# Patient Record
Sex: Male | Born: 1990 | Race: White | Hispanic: No | Marital: Single | Smoking: Former smoker
Health system: Southern US, Community
[De-identification: ages and names within clinical notes are randomized; demographics above are authoritative.]

## PROBLEM LIST (undated history)

## (undated) DIAGNOSIS — R0602 Shortness of breath: Secondary | ICD-10-CM

## (undated) DIAGNOSIS — G709 Myoneural disorder, unspecified: Secondary | ICD-10-CM

## (undated) HISTORY — PX: OTHER SURGICAL HISTORY: SHX169

---

## 2011-07-30 ENCOUNTER — Other Ambulatory Visit: Payer: Self-pay

## 2011-07-30 DIAGNOSIS — J9859 Other diseases of mediastinum, not elsewhere classified: Secondary | ICD-10-CM | POA: Insufficient documentation

## 2011-07-31 ENCOUNTER — Encounter: Payer: BC Managed Care – PPO | Admitting: Thoracic Surgery (Cardiothoracic Vascular Surgery)

## 2011-07-31 ENCOUNTER — Other Ambulatory Visit: Payer: Self-pay

## 2011-08-05 ENCOUNTER — Institutional Professional Consult (permissible substitution) (INDEPENDENT_AMBULATORY_CARE_PROVIDER_SITE_OTHER): Payer: BC Managed Care – PPO | Admitting: Thoracic Surgery (Cardiothoracic Vascular Surgery)

## 2011-08-05 ENCOUNTER — Encounter: Payer: Self-pay | Admitting: Thoracic Surgery (Cardiothoracic Vascular Surgery)

## 2011-08-05 VITALS — BP 116/76 | HR 72 | Resp 18 | Ht 70.5 in | Wt 215.0 lb

## 2011-08-05 DIAGNOSIS — D382 Neoplasm of uncertain behavior of pleura: Secondary | ICD-10-CM

## 2011-08-05 DIAGNOSIS — D384 Neoplasm of uncertain behavior of thymus: Secondary | ICD-10-CM

## 2011-08-05 NOTE — Progress Notes (Signed)
PCP is [name],[name], MD, MD Referring Provider is Philemon Kingdom, MD  Chief Complaint  Patient presents with  . Mediastinal Mass    Referral from Dr Sudie Bailey for eval on mediastinal mass seen on Chest CT 07/22/11    HPI: Christian Ho is a 21 year old young man who presents with the chief complaint left shoulder pain. He says this been having pain in his left shoulder on and off for about the last 8 or 9 months. He attributed this to working out. This is mainly hurts in the pectoral area on the left side. He says sometimes simple is region of his left arm to wash the right side of his body in the shower can cause severe pain. He's had to curtail his workout regimen. He feels like the pain is deeper than the muscle. More recently he had an episode where he had some difficulty swallowing, that lasted a couple of days. He is also been noticing some shortness of breath sometimes when he lies flat and sometimes went active. He went to an urgent care one time when he was having some shortness of breath and the pain, chest x-ray at that time did not show any abnormality. Shortly thereafter he had a more severe episode and he went to the emergency room. A CT of the chest was done which showed a 2.9 x 2.4 cm mass in the anterior mediastinum. He saw Dr. Sudie Bailey. Thyroid panel was done as were alpha-fetoprotein and beta hCG. Both AFP and hCG were within normal limits.   No past medical history on file.  Past Surgical History  Procedure Date  . Right wrist orif   . Left club foot surgery in 1991     Family History  Problem Relation Age of Onset  . Breast cancer Maternal Grandmother   . Lung cancer Maternal Grandmother     Social History History  Substance Use Topics  . Smoking status: Former Smoker    Types: Cigarettes    Quit date: 05/12/2010  . Smokeless tobacco: Not on file  . Alcohol Use: No    Current Outpatient Prescriptions  Medication Sig Dispense Refill  . traMADol (ULTRAM)  50 MG tablet         No Known Allergies  Review of Systems  Constitutional: Negative.   HENT: Negative.   Respiratory: Positive for shortness of breath.   Cardiovascular: Positive for chest pain.  Gastrointestinal:       Difficulty swallowing  Neurological: Positive for numbness (left arm numbness on one occasion).  Hematological: Negative.   All other systems reviewed and are negative.    BP 116/76  Pulse 72  Resp 18  Ht 5' 10.5" (1.791 m)  Wt 215 lb (97.523 kg)  BMI 30.41 kg/m2  SpO2 98% Physical Exam  Constitutional: He is oriented to person, place, and time. He appears well-developed and well-nourished. No distress.  HENT:  Head: Normocephalic and atraumatic.  Eyes: EOM are normal. Pupils are equal, round, and reactive to light.  Neck: Neck supple. No JVD present. No tracheal deviation present. No thyromegaly present.  Cardiovascular: Normal rate, regular rhythm and intact distal pulses.   Pulmonary/Chest: Effort normal and breath sounds normal. He has no wheezes. He has no rales.  Abdominal: Soft.  Musculoskeletal: He exhibits no edema.  Lymphadenopathy:    He has no cervical adenopathy.  Neurological: He is alert and oriented to person, place, and time. No cranial nerve deficit.  Skin: Skin is warm and dry.  Psychiatric: He has  a normal mood and affect.     Diagnostic Tests: CT chest at [facility] 07/23/2011- 2.9 x 2.7 cm anterior mediastinal mass. No other masses or adenopathy.  Impression: 21 year old male with a 2.5 to 3 cm mass in the anterior mediastinum. The differential diagnosis includes thymoma, thyroid, lymphoma, returned cell tumors. 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 It's very isolated to be a lymphoma. Terms of tumors not ruled out but is less likely in the setting of negative tumor markers. The lesion appears characteristic for a thymoma. The lesion itself is fairly well circumscribed and lobulated in its midportion,  however it does become indistinct in the superior and inferior margins. It is not certain that this lesion is responsible for any of his symptoms although it is concerning that he possibly could have some local invasion causing symptoms. Given his young age and otherwise good health as well as symptomatology I think it would be best to go ahead and resect this mass  I discussed with the patient and his mother who was present the indications, risks, benefits, and alternatives. They understand this her radiographic followup would be an alternative, but are not interested in that approach. We discussed different surgical approaches to resection including a left VATS versus a partial upper sternotomy. I think in his case with partial upper sternotomy would be the better option. I discussed with them the general nature of the procedure, need for general anesthesia, expected hospital stay, and overall recovery. I did discuss with them the risks, which are very low given his young age and general good health, of the procedure. They understand the risks include but are not limited to death(remote possibility), DVT, PE, bleeding, possible need for transfusion, infection, other organ system dysfunction. They do understand there is no guarantee that resecting the mass will relieve his symptoms as, once again, it is not entirely clear this source of the symptomatology.  He wishes to proceed with surgical resection.  Plan: And thymectomy via partial upper sternotomy on Wednesday, April 3. He will be admitted on the day of surgery.

## 2011-08-06 ENCOUNTER — Other Ambulatory Visit: Payer: Self-pay

## 2011-08-06 ENCOUNTER — Encounter (HOSPITAL_COMMUNITY): Payer: Self-pay | Admitting: Pharmacy Technician

## 2011-08-06 DIAGNOSIS — R222 Localized swelling, mass and lump, trunk: Secondary | ICD-10-CM

## 2011-08-11 ENCOUNTER — Ambulatory Visit (HOSPITAL_COMMUNITY)
Admission: RE | Admit: 2011-08-11 | Discharge: 2011-08-11 | Disposition: A | Discharge status: Home / Self Care | Payer: BC Managed Care – PPO | Source: Ambulatory Visit | Attending: Thoracic Surgery (Cardiothoracic Vascular Surgery) | Admitting: Thoracic Surgery (Cardiothoracic Vascular Surgery)

## 2011-08-11 ENCOUNTER — Encounter (HOSPITAL_COMMUNITY): Payer: Self-pay

## 2011-08-11 ENCOUNTER — Encounter (HOSPITAL_COMMUNITY)
Admission: RE | Admit: 2011-08-11 | Discharge: 2011-08-11 | Disposition: A | Discharge status: Home / Self Care | Payer: BC Managed Care – PPO | Source: Ambulatory Visit | Attending: Thoracic Surgery (Cardiothoracic Vascular Surgery) | Admitting: Thoracic Surgery (Cardiothoracic Vascular Surgery)

## 2011-08-11 VITALS — BP 118/78 | HR 67 | Temp 97.0°F | Resp 20 | Ht 70.0 in | Wt 213.4 lb

## 2011-08-11 DIAGNOSIS — Z01812 Encounter for preprocedural laboratory examination: Secondary | ICD-10-CM | POA: Insufficient documentation

## 2011-08-11 DIAGNOSIS — R222 Localized swelling, mass and lump, trunk: Secondary | ICD-10-CM

## 2011-08-11 DIAGNOSIS — Z01818 Encounter for other preprocedural examination: Secondary | ICD-10-CM | POA: Insufficient documentation

## 2011-08-11 HISTORY — DX: Myoneural disorder, unspecified: G70.9

## 2011-08-11 HISTORY — DX: Shortness of breath: R06.02

## 2011-08-11 LAB — BLOOD GAS, ARTERIAL
Bicarbonate: 21.1 mEq/L (ref 20.0–24.0)
O2 Saturation: 96.2 %
Patient temperature: 98.6
TCO2: 22.2 mmol/L (ref 0–100)

## 2011-08-11 LAB — URINALYSIS, ROUTINE W REFLEX MICROSCOPIC
Bilirubin Urine: NEGATIVE
Hgb urine dipstick: NEGATIVE
Specific Gravity, Urine: 1.021 (ref 1.005–1.030)
Urobilinogen, UA: 0.2 mg/dL (ref 0.0–1.0)

## 2011-08-11 LAB — COMPREHENSIVE METABOLIC PANEL
Alkaline Phosphatase: 86 U/L (ref 39–117)
BUN: 18 mg/dL (ref 6–23)
Calcium: 10 mg/dL (ref 8.4–10.5)
Creatinine, Ser: 1.06 mg/dL (ref 0.50–1.35)
GFR calc Af Amer: >90 mL/min (ref >90)
Glucose, Bld: 71 mg/dL (ref 70–99)
Total Protein: 7.7 g/dL (ref 6.0–8.3)

## 2011-08-11 LAB — PROTIME-INR
INR: 1.04 (ref 0.00–1.49)
Prothrombin Time: 13.8 seconds (ref 11.6–15.2)

## 2011-08-11 LAB — TYPE AND SCREEN
ABO/RH(D): O NEG
Antibody Screen: NEGATIVE

## 2011-08-11 LAB — CBC
HCT: 44.4 % (ref 39.0–52.0)
Hemoglobin: 14.5 g/dL (ref 13.0–17.0)
MCH: 27.1 pg (ref 26.0–34.0)
MCHC: 32.7 g/dL (ref 30.0–36.0)
MCV: 82.8 fL (ref 78.0–100.0)

## 2011-08-11 LAB — ABO/RH: ABO/RH(D): O NEG

## 2011-08-11 LAB — SURGICAL PCR SCREEN: Staphylococcus aureus: NEGATIVE

## 2011-08-11 NOTE — Pre-Procedure Instructions (Signed)
20 MAKENZIE VITTORIO  08/11/2011   Your procedure is scheduled on:  08/13/2011  Report to [facility] at 6:30 AM.  Call this number if you have problems the morning of surgery: [phone]   Remember:   Do not eat food:After Midnight.  May have clear liquids: up to 4 Hours before arrival.  Nothing after 2:30a.m.  Clear liquids include soda, tea, black coffee, apple or grape juice, broth.  Take these medicines the morning of surgery with A SIP OF WATER: NOTHING   Do not wear jewelry, make-up or nail polish.  Do not wear lotions, powders, or perfumes. You may wear deodorant.  Do not shave 48 hours prior to surgery.  Do not bring valuables to the hospital.  Contacts, dentures or bridgework may not be worn into surgery.  Leave suitcase in the car. After surgery it may be brought to your room.  For patients admitted to the hospital, checkout time is 11:00 AM the day of discharge.   Patients discharged the day of surgery will not be allowed to drive home.  Name and phone number of your driver: with Mother   Special Instructions: CHG Shower Use Special Wash: 1/2 bottle night before surgery and 1/2 bottle morning of surgery.   Please read over the following fact sheets that you were given: Pain Booklet, Coughing and Deep Breathing, Blood Transfusion Information, MRSA Information and Surgical Site Infection Prevention

## 2011-08-11 NOTE — Progress Notes (Signed)
Call to [facility] to clarify the prep order. Determined that the prep should be axilla (bilateral) & chest.

## 2011-08-11 NOTE — Progress Notes (Signed)
Call to [facility] requested EKG- done in ER on 07/22/2011

## 2011-08-12 MED ORDER — CEFUROXIME SODIUM 1.5 G IJ SOLR
1.5000 g | INTRAMUSCULAR | Status: AC
  Administered 2011-08-13: 1.5 g via INTRAVENOUS
  Filled 2011-08-12: qty 1.5

## 2011-08-13 ENCOUNTER — Encounter (HOSPITAL_COMMUNITY)
Admission: RE | Disposition: A | Discharge status: Home / Self Care | Payer: Self-pay | Source: Ambulatory Visit | Attending: Thoracic Surgery (Cardiothoracic Vascular Surgery)

## 2011-08-13 ENCOUNTER — Encounter (HOSPITAL_COMMUNITY): Payer: Self-pay | Admitting: Anesthesiology

## 2011-08-13 ENCOUNTER — Ambulatory Visit (HOSPITAL_COMMUNITY): Payer: BC Managed Care – PPO | Admitting: Anesthesiology

## 2011-08-13 ENCOUNTER — Ambulatory Visit (HOSPITAL_COMMUNITY): Payer: BC Managed Care – PPO

## 2011-08-13 ENCOUNTER — Encounter (HOSPITAL_COMMUNITY): Payer: Self-pay | Admitting: *Deleted

## 2011-08-13 ENCOUNTER — Inpatient Hospital Stay (HOSPITAL_COMMUNITY)
Admission: RE | Admit: 2011-08-13 | Discharge: 2011-08-16 | DRG: 394 | Disposition: A | Discharge status: Home / Self Care | Payer: BC Managed Care – PPO | Source: Ambulatory Visit | Attending: Thoracic Surgery (Cardiothoracic Vascular Surgery) | Admitting: Thoracic Surgery (Cardiothoracic Vascular Surgery)

## 2011-08-13 DIAGNOSIS — R222 Localized swelling, mass and lump, trunk: Secondary | ICD-10-CM

## 2011-08-13 DIAGNOSIS — E328 Other diseases of thymus: Principal | ICD-10-CM | POA: Diagnosis present

## 2011-08-13 DIAGNOSIS — R110 Nausea: Secondary | ICD-10-CM | POA: Diagnosis not present

## 2011-08-13 HISTORY — PX: MEDIASTERNOTOMY: SHX5084

## 2011-08-13 SURGERY — MEDIAN STERNOTOMY
Anesthesia: General | Site: Chest | Wound class: Clean

## 2011-08-13 MED ORDER — KETOROLAC TROMETHAMINE 30 MG/ML IJ SOLN
30.0000 mg | Freq: Four times a day (QID) | INTRAMUSCULAR | Status: AC
  Administered 2011-08-13 – 2011-08-15 (×8): 30 mg via INTRAVENOUS
  Filled 2011-08-13 (×10): qty 1

## 2011-08-13 MED ORDER — SODIUM CHLORIDE 0.9 % IJ SOLN: 9.0000 mL | INTRAMUSCULAR | Status: DC | PRN

## 2011-08-13 MED ORDER — DIPHENHYDRAMINE HCL 12.5 MG/5ML PO ELIX
12.5000 mg | ORAL_SOLUTION | Freq: Four times a day (QID) | ORAL | Status: DC | PRN
  Filled 2011-08-13: qty 5

## 2011-08-13 MED ORDER — HYDROMORPHONE HCL PF 1 MG/ML IJ SOLN
INTRAMUSCULAR | Status: AC
  Administered 2011-08-13: 0.5 mg via INTRAVENOUS
  Filled 2011-08-13: qty 1

## 2011-08-13 MED ORDER — FENTANYL 10 MCG/ML IV SOLN
INTRAVENOUS | Status: DC
  Administered 2011-08-13: 120 ug via INTRAVENOUS
  Administered 2011-08-13: 225 ug via INTRAVENOUS
  Administered 2011-08-13: 13:00:00 via INTRAVENOUS
  Administered 2011-08-13: 45 ug via INTRAVENOUS
  Administered 2011-08-13: 90 ug via INTRAVENOUS
  Administered 2011-08-14: 156 ug via INTRAVENOUS
  Administered 2011-08-14: 120 ug via INTRAVENOUS
  Administered 2011-08-14: 162.9 ug via INTRAVENOUS
  Filled 2011-08-13 (×3): qty 50

## 2011-08-13 MED ORDER — ONDANSETRON HCL 4 MG/2ML IJ SOLN
4.0000 mg | Freq: Four times a day (QID) | INTRAMUSCULAR | Status: DC | PRN
  Filled 2011-08-13: qty 2

## 2011-08-13 MED ORDER — 0.9 % SODIUM CHLORIDE (POUR BTL) OPTIME
TOPICAL | Status: DC | PRN
  Administered 2011-08-13: 1000 mL

## 2011-08-13 MED ORDER — DIPHENHYDRAMINE HCL 50 MG/ML IJ SOLN
12.5000 mg | Freq: Four times a day (QID) | INTRAMUSCULAR | Status: DC | PRN
  Filled 2011-08-13: qty 0.25

## 2011-08-13 MED ORDER — LACTATED RINGERS IV SOLN
INTRAVENOUS | Status: DC | PRN
  Administered 2011-08-13: 08:00:00 via INTRAVENOUS

## 2011-08-13 MED ORDER — POTASSIUM CHLORIDE 10 MEQ/50ML IV SOLN: 10.0000 meq | Freq: Every day | INTRAVENOUS | Status: DC | PRN

## 2011-08-13 MED ORDER — VECURONIUM BROMIDE 10 MG IV SOLR
INTRAVENOUS | Status: DC | PRN
  Administered 2011-08-13 (×2): 1 mg via INTRAVENOUS
  Administered 2011-08-13: 2 mg via INTRAVENOUS
  Administered 2011-08-13: 1 mg via INTRAVENOUS

## 2011-08-13 MED ORDER — KETOROLAC TROMETHAMINE 30 MG/ML IJ SOLN
INTRAMUSCULAR | Status: AC
  Administered 2011-08-13: 30 mg via INTRAVENOUS
  Filled 2011-08-13: qty 1

## 2011-08-13 MED ORDER — OXYCODONE-ACETAMINOPHEN 5-325 MG PO TABS
1.0000 | ORAL_TABLET | ORAL | Status: DC | PRN
  Administered 2011-08-15: 1 via ORAL
  Administered 2011-08-15 – 2011-08-16 (×4): 2 via ORAL
  Filled 2011-08-13 (×4): qty 2
  Filled 2011-08-13: qty 1

## 2011-08-13 MED ORDER — PROPOFOL 10 MG/ML IV EMUL
INTRAVENOUS | Status: DC | PRN
  Administered 2011-08-13: 200 mg via INTRAVENOUS

## 2011-08-13 MED ORDER — ACETAMINOPHEN 10 MG/ML IV SOLN
INTRAVENOUS | Status: AC
  Administered 2011-08-13: 1000 mg via INTRAVENOUS
  Filled 2011-08-13: qty 100

## 2011-08-13 MED ORDER — BISACODYL 5 MG PO TBEC
10.0000 mg | DELAYED_RELEASE_TABLET | Freq: Every day | ORAL | Status: DC
  Administered 2011-08-14 – 2011-08-16 (×3): 10 mg via ORAL
  Filled 2011-08-13 (×3): qty 2

## 2011-08-13 MED ORDER — HYDROMORPHONE HCL PF 1 MG/ML IJ SOLN
0.2500 mg | INTRAMUSCULAR | Status: DC | PRN
  Administered 2011-08-13 (×5): 0.5 mg via INTRAVENOUS

## 2011-08-13 MED ORDER — HYDROMORPHONE HCL PF 1 MG/ML IJ SOLN
0.5000 mg | INTRAMUSCULAR | Status: DC | PRN
  Administered 2011-08-13: 0.5 mg via INTRAVENOUS

## 2011-08-13 MED ORDER — ONDANSETRON HCL 4 MG/2ML IJ SOLN: 4.0000 mg | Freq: Once | INTRAMUSCULAR | Status: DC | PRN

## 2011-08-13 MED ORDER — OXYCODONE HCL 5 MG PO TABS: 5.0000 mg | ORAL_TABLET | ORAL | Status: AC | PRN

## 2011-08-13 MED ORDER — LACTATED RINGERS IV SOLN
INTRAVENOUS | Status: DC | PRN
  Administered 2011-08-13 (×2): via INTRAVENOUS

## 2011-08-13 MED ORDER — SODIUM CHLORIDE 0.9 % IJ SOLN
OROMUCOSAL | Status: DC | PRN
  Administered 2011-08-13: 10:00:00 via TOPICAL

## 2011-08-13 MED ORDER — NALOXONE HCL 0.4 MG/ML IJ SOLN
0.4000 mg | INTRAMUSCULAR | Status: DC | PRN
  Filled 2011-08-13: qty 1

## 2011-08-13 MED ORDER — LIDOCAINE HCL (CARDIAC) 20 MG/ML IV SOLN
INTRAVENOUS | Status: DC | PRN
  Administered 2011-08-13: 60 mg via INTRAVENOUS

## 2011-08-13 MED ORDER — FENTANYL CITRATE 0.05 MG/ML IJ SOLN
INTRAMUSCULAR | Status: DC | PRN
  Administered 2011-08-13: 50 ug via INTRAVENOUS
  Administered 2011-08-13: 350 ug via INTRAVENOUS
  Administered 2011-08-13 (×2): 100 ug via INTRAVENOUS
  Administered 2011-08-13: 25 ug via INTRAVENOUS
  Administered 2011-08-13: 50 ug via INTRAVENOUS
  Administered 2011-08-13: 25 ug via INTRAVENOUS
  Administered 2011-08-13: 50 ug via INTRAVENOUS

## 2011-08-13 MED ORDER — DEXTROSE-NACL 5-0.45 % IV SOLN
INTRAVENOUS | Status: DC
  Administered 2011-08-13 – 2011-08-14 (×3): via INTRAVENOUS
  Administered 2011-08-15: 20 mL/h via INTRAVENOUS

## 2011-08-13 MED ORDER — MIDAZOLAM HCL 5 MG/5ML IJ SOLN
INTRAMUSCULAR | Status: DC | PRN
  Administered 2011-08-13 (×2): 2 mg via INTRAVENOUS

## 2011-08-13 MED ORDER — ROCURONIUM BROMIDE 100 MG/10ML IV SOLN
INTRAVENOUS | Status: DC | PRN
  Administered 2011-08-13: 50 mg via INTRAVENOUS

## 2011-08-13 MED ORDER — NEOSTIGMINE METHYLSULFATE 1 MG/ML IJ SOLN
INTRAMUSCULAR | Status: DC | PRN
  Administered 2011-08-13: 5 mg via INTRAVENOUS

## 2011-08-13 MED ORDER — ONDANSETRON HCL 4 MG/2ML IJ SOLN
4.0000 mg | Freq: Four times a day (QID) | INTRAMUSCULAR | Status: DC | PRN
  Administered 2011-08-13: 4 mg via INTRAVENOUS
  Filled 2011-08-13: qty 2

## 2011-08-13 MED ORDER — ONDANSETRON HCL 4 MG/2ML IJ SOLN
INTRAMUSCULAR | Status: DC | PRN
  Administered 2011-08-13: 4 mg via INTRAVENOUS

## 2011-08-13 MED ORDER — SENNOSIDES-DOCUSATE SODIUM 8.6-50 MG PO TABS
1.0000 | ORAL_TABLET | Freq: Every evening | ORAL | Status: DC | PRN
  Filled 2011-08-13: qty 1

## 2011-08-13 MED ORDER — MIDAZOLAM HCL 2 MG/2ML IJ SOLN: 2.0000 mg | Freq: Once | INTRAMUSCULAR | Status: DC

## 2011-08-13 MED ORDER — ACETAMINOPHEN 10 MG/ML IV SOLN
1000.0000 mg | Freq: Four times a day (QID) | INTRAVENOUS | Status: AC
  Administered 2011-08-13 – 2011-08-14 (×3): 1000 mg via INTRAVENOUS
  Filled 2011-08-13 (×6): qty 100

## 2011-08-13 MED ORDER — GLYCOPYRROLATE 0.2 MG/ML IJ SOLN
INTRAMUSCULAR | Status: DC | PRN
  Administered 2011-08-13: 1 mg via INTRAVENOUS

## 2011-08-13 SURGICAL SUPPLY — 53 items
APPLIER CLIP 9.375 SM OPEN (CLIP) ×2
BLADE CORE FAN STRYKER (BLADE) ×2 IMPLANT
BLADE OSC/SAG .038X5.5 CUT EDG (BLADE) ×2 IMPLANT
BLADE SAW STERNAL (BLADE) ×2 IMPLANT
BLADE SURG 11 STRL SS (BLADE) IMPLANT
BLADE SURG ROTATE 9660 (MISCELLANEOUS) ×2 IMPLANT
CANISTER SUCTION 2500CC (MISCELLANEOUS) ×2 IMPLANT
CATH THORACIC 28FR (CATHETERS) IMPLANT
CATH THORACIC 28FR RT ANG (CATHETERS) IMPLANT
CLIP APPLIE 9.375 SM OPEN (CLIP) ×1 IMPLANT
CLIP TI MEDIUM 24 (CLIP) ×2 IMPLANT
CLIP TI WIDE RED SMALL 24 (CLIP) IMPLANT
CLOTH BEACON ORANGE TIMEOUT ST (SAFETY) ×2 IMPLANT
CLSR STERI-STRIP ANTIMIC 1/2X4 (GAUZE/BANDAGES/DRESSINGS) ×2 IMPLANT
CONT SPEC 4OZ CLIKSEAL STRL BL (MISCELLANEOUS) ×2 IMPLANT
COVER SURGICAL LIGHT HANDLE (MISCELLANEOUS) ×4 IMPLANT
DRAIN CHANNEL 19F RND (DRAIN) ×2 IMPLANT
DRAPE LAPAROSCOPIC ABDOMINAL (DRAPES) ×2 IMPLANT
DRSG COVADERM 4X8 (GAUZE/BANDAGES/DRESSINGS) ×2 IMPLANT
ELECT REM PT RETURN 9FT ADLT (ELECTROSURGICAL) ×2
ELECTRODE REM PT RTRN 9FT ADLT (ELECTROSURGICAL) ×1 IMPLANT
EVACUATOR SILICONE 100CC (DRAIN) ×2 IMPLANT
GEL ULTRASOUND 20GR AQUASONIC (MISCELLANEOUS) ×2 IMPLANT
GLOVE EUDERMIC 7 POWDERFREE (GLOVE) ×2 IMPLANT
GOWN PREVENTION PLUS XLARGE (GOWN DISPOSABLE) ×2 IMPLANT
GOWN STRL NON-REIN LRG LVL3 (GOWN DISPOSABLE) ×4 IMPLANT
HEMOSTAT POWDER SURGIFOAM 1G (HEMOSTASIS) IMPLANT
KIT BASIN OR (CUSTOM PROCEDURE TRAY) ×2 IMPLANT
KIT PAIN CUSTOM (MISCELLANEOUS) IMPLANT
KIT ROOM TURNOVER OR (KITS) ×2 IMPLANT
KIT SUCTION CATH 14FR (SUCTIONS) IMPLANT
NS IRRIG 1000ML POUR BTL (IV SOLUTION) ×4 IMPLANT
PACK CHEST (CUSTOM PROCEDURE TRAY) ×2 IMPLANT
PAD ARMBOARD 7.5X6 YLW CONV (MISCELLANEOUS) ×4 IMPLANT
SOLUTION ANTI FOG 6CC (MISCELLANEOUS) ×2 IMPLANT
SPONGE GAUZE 4X4 12PLY (GAUZE/BANDAGES/DRESSINGS) ×2 IMPLANT
SPONGE INTESTINAL PEANUT (DISPOSABLE) ×2 IMPLANT
SUT PROLENE 4 0 RB 1 (SUTURE)
SUT PROLENE 4-0 RB1 .5 CRCL 36 (SUTURE) IMPLANT
SUT PROLENE 5 0 C 1 36 (SUTURE) ×2 IMPLANT
SUT SILK  1 MH (SUTURE) ×1
SUT SILK 1 MH (SUTURE) ×1 IMPLANT
SUT SILK 2 0 SH CR/8 (SUTURE) ×2 IMPLANT
SUT STEEL 6MS V (SUTURE) ×2 IMPLANT
SUT VIC AB 1 CTX 27 (SUTURE) ×4 IMPLANT
SUT VIC AB 2-0 CTX 36 (SUTURE) ×4 IMPLANT
SUT VIC AB 3-0 X1 27 (SUTURE) ×4 IMPLANT
SYSTEM SAHARA CHEST DRAIN RE-I (WOUND CARE) IMPLANT
TAPE CLOTH SURG 4X10 WHT LF (GAUZE/BANDAGES/DRESSINGS) ×2 IMPLANT
TOWEL OR 17X24 6PK STRL BLUE (TOWEL DISPOSABLE) ×2 IMPLANT
TOWEL OR 17X26 10 PK STRL BLUE (TOWEL DISPOSABLE) ×4 IMPLANT
TRAY FOLEY CATH 14FRSI W/METER (CATHETERS) ×2 IMPLANT
WATER STERILE IRR 1000ML POUR (IV SOLUTION) ×2 IMPLANT

## 2011-08-13 NOTE — H&P (Signed)
PCP is [name],[name], MD, MD  Referring Provider is Philemon Kingdom, MD  Chief Complaint   Patient presents with   .  Mediastinal Mass     Referral from Dr Sudie Bailey for eval on mediastinal mass seen on Chest CT 07/22/11    HPI: Christian Ho is a 21 year old young man who presents with the chief complaint left shoulder pain. He says this been having pain in his left shoulder on and off for about the last 8 or 9 months. He attributed this to working out. This is mainly hurts in the pectoral area on the left side. He says sometimes simple is region of his left arm to wash the right side of his body in the shower can cause severe pain. He's had to curtail his workout regimen. He feels like the pain is deeper than the muscle. More recently he had an episode where he had some difficulty swallowing, that lasted a couple of days. He is also been noticing some shortness of breath sometimes when he lies flat and sometimes went active. He went to an urgent care one time when he was having some shortness of breath and the pain, chest x-ray at that time did not show any abnormality. Shortly thereafter he had a more severe episode and he went to the emergency room. A CT of the chest was done which showed a 2.9 x 2.4 cm mass in the anterior mediastinum. He saw Dr. Sudie Bailey. Thyroid panel was done as were alpha-fetoprotein and beta hCG. Both AFP and hCG were within normal limits.  No past medical history on file.  Past Surgical History   Procedure  Date   .  Right wrist orif    .  Left club foot surgery in 1991     Family History   Problem  Relation  Age of Onset   .  Breast cancer  Maternal Grandmother    .  Lung cancer  Maternal Grandmother     Social History  History   Substance Use Topics   .  Smoking status:  Former Smoker     Types:  Cigarettes     Quit date:  05/12/2010   .  Smokeless tobacco:  Not on file   .  Alcohol Use:  No    Current Outpatient Prescriptions   Medication  Sig  Dispense   Refill   .  traMADol (ULTRAM) 50 MG tablet       No Known Allergies  Review of Systems  Constitutional: Negative.  HENT: Negative.  Respiratory: Positive for shortness of breath.  Cardiovascular: Positive for chest pain.  Gastrointestinal:  Difficulty swallowing  Neurological: Positive for numbness (left arm numbness on one occasion).  Hematological: Negative.  All other systems reviewed and are negative.   BP 116/76  Pulse 72  Resp 18  Ht 5' 10.5" (1.791 m)  Wt 215 lb (97.523 kg)  BMI 30.41 kg/m2  SpO2 98%  Physical Exam  Constitutional: He is oriented to person, place, and time. He appears well-developed and well-nourished. No distress.  HENT:  Head: Normocephalic and atraumatic.  Eyes: EOM are normal. Pupils are equal, round, and reactive to light.  Neck: Neck supple. No JVD present. No tracheal deviation present. No thyromegaly present.  Cardiovascular: Normal rate, regular rhythm and intact distal pulses.  Pulmonary/Chest: Effort normal and breath sounds normal. He has no wheezes. He has no rales.  Abdominal: Soft.  Musculoskeletal: He exhibits no edema.  Lymphadenopathy:  He has no cervical adenopathy.  Neurological: He is alert and oriented to person, place, and time. No cranial nerve deficit.  Skin: Skin is warm and dry.  Psychiatric: He has a normal mood and affect.   Diagnostic Tests:  CT chest at [facility] 07/23/2011- 2.9 x 2.7 cm anterior mediastinal mass. No other masses or adenopathy.  Impression:  21 year old male with a 2.5 to 3 cm mass in the anterior mediastinum. The differential diagnosis includes thymoma, thyroid, lymphoma, returned cell tumors. 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 It's very isolated to be a lymphoma. Terms of tumors not ruled out but is less likely in the setting of negative tumor markers. The lesion appears characteristic for a thymoma. The lesion itself is fairly well circumscribed and lobulated in its  midportion, however it does become indistinct in the superior and inferior margins. It is not certain that this lesion is responsible for any of his symptoms although it is concerning that he possibly could have some local invasion causing symptoms. Given his young age and otherwise good health as well as symptomatology I think it would be best to go ahead and resect this mass  I discussed with the patient and his mother who was present the indications, risks, benefits, and alternatives. They understand this her radiographic followup would be an alternative, but are not interested in that approach. We discussed different surgical approaches to resection including a left VATS versus a partial upper sternotomy. I think in his case with partial upper sternotomy would be the better option. I discussed with them the general nature of the procedure, need for general anesthesia, expected hospital stay, and overall recovery. I did discuss with them the risks, which are very low given his young age and general good health, of the procedure. They understand the risks include but are not limited to death(remote possibility), DVT, PE, bleeding, possible need for transfusion, infection, other organ system dysfunction. They do understand there is no guarantee that resecting the mass will relieve his symptoms as, once again, it is not entirely clear this source of the symptomatology.  He wishes to proceed with surgical resection.  Plan:  And thymectomy via partial upper sternotomy on Wednesday, April 3. He will be admitted on the day of surgery.  No new complaints this AM. He is prepared for surgery.

## 2011-08-13 NOTE — Interval H&P Note (Signed)
History and Physical Interval Note:  08/13/2011 8:25 AM  Christian Ho  has presented today for surgery, with the diagnosis of ANTERIOR MEDIASTINAL MASS  The various methods of treatment have been discussed with the patient and family. After consideration of risks, benefits and other options for treatment, the patient has consented to  Procedure(s) (LRB): MEDIASTERNOTOMY (N/A) THYMECTOMY (N/A) as a surgical intervention .  The patients' history has been reviewed, patient examined, no change in status, stable for surgery.  I have reviewed the patients' chart and labs.  Questions were answered to the patient's satisfaction.     [name]

## 2011-08-13 NOTE — Brief Op Note (Addendum)
08/13/2011  11:02 AM  PATIENT:  Christian Ho  21 y.o. male  PRE-OPERATIVE DIAGNOSIS:  ANTERIOR MEDIASTINAL MASS  POST-OPERATIVE DIAGNOSIS:  ANTERIOR MEDIASTINAL MASS  PROCEDURE:  Procedure(s) (LRB): MEDIASTERNOTOMY Mini( Partial T through 3rd interspace) THYMECTOMY (N/A)  SURGEON:  Surgeon(s) and Role:    * Loreli Slot, MD - Primary  PHYSICIAN ASSISTANT Erin Barrett PA-C   ANESTHESIA:   general  EBL:  Total I/O In: 1650 [I.V.:1650] Out: -   DRAINS: 19 Blake drain in right chest   SPECIMEN:  Source of Specimen:  Thymus  DISPOSITION OF SPECIMEN:  PATHOLOGY  COUNTS:  YES  PATIENT DISPOSITION:  PACU -  Extubated and   hemodynamically stable.  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 No invasion.

## 2011-08-13 NOTE — Anesthesia Procedure Notes (Signed)
Procedure Name: Intubation Date/Time: 08/13/2011 8:59 AM Performed by: Mancel Parsons Pre-anesthesia Checklist: Patient identified, Timeout performed, Emergency Drugs available, Suction available and Patient being monitored Patient Re-evaluated:Patient Re-evaluated prior to inductionOxygen Delivery Method: Circle system utilized Preoxygenation: Pre-oxygenation with 100% oxygen Intubation Type: IV induction Ventilation: Mask ventilation without difficulty Laryngoscope Size: Mac and 3 Grade View: Grade I Tube type: Oral Tube size: 8.0 mm Number of attempts: 1 Airway Equipment and Method: Stylet Placement Confirmation: ETT inserted through vocal cords under direct vision,  positive ETCO2 and breath sounds checked- equal and bilateral Secured at: 22 cm Tube secured with: Tape Dental Injury: Teeth and Oropharynx as per pre-operative assessment

## 2011-08-13 NOTE — Transfer of Care (Signed)
Immediate Anesthesia Transfer of Care Note  Patient: Christian Ho  Procedure(s) Performed: Procedure(s) (LRB): MEDIASTERNOTOMY (N/A) THYMECTOMY (N/A)  Patient Location: PACU  Anesthesia Type: General  Level of Consciousness: awake and alert   Airway & Oxygen Therapy: Patient Spontanous Breathing and Patient connected to nasal cannula oxygen  Post-op Assessment: Report given to PACU RN and Post -op Vital signs reviewed and stable  Post vital signs: Reviewed  Complications: No apparent anesthesia complications

## 2011-08-13 NOTE — Anesthesia Postprocedure Evaluation (Signed)
  Anesthesia Post-op Note  Patient: Christian Ho  Procedure(s) Performed: Procedure(s) (LRB): MEDIASTERNOTOMY (N/A) THYMECTOMY (N/A)  Patient Location: PACU  Anesthesia Type: General  Level of Consciousness: awake, alert  and oriented  Airway and Oxygen Therapy: Patient Spontanous Breathing and Patient connected to nasal cannula oxygen  Post-op Pain: mild  Post-op Assessment: Post-op Vital signs reviewed and Patient's Cardiovascular Status Stable  Post-op Vital Signs: stable  Complications: No apparent anesthesia complications 

## 2011-08-13 NOTE — Anesthesia Preprocedure Evaluation (Addendum)
Anesthesia Evaluation  Patient identified by MRN, date of birth, ID band Patient awake    Reviewed: Allergy & Precautions, H&P , NPO status , Patient's Chart, lab work & pertinent test results, reviewed documented beta blocker date and time   Airway Mallampati: I      Dental  (+) Teeth Intact and Dental Advisory Given   Pulmonary shortness of breath,  breath sounds clear to auscultation        Cardiovascular Rhythm:Regular Rate:Normal     Neuro/Psych  Neuromuscular disease    GI/Hepatic   Endo/Other    Renal/GU      Musculoskeletal   Abdominal   Peds  Hematology   Anesthesia Other Findings   Reproductive/Obstetrics                          Anesthesia Physical Anesthesia Plan  ASA: II  Anesthesia Plan: General   Post-op Pain Management:    Induction: Intravenous  Airway Management Planned: Oral ETT  Additional Equipment: Arterial line  Intra-op Plan:   Post-operative Plan: Extubation in OR  Informed Consent: I have reviewed the patients History and Physical, chart, labs and discussed the procedure including the risks, benefits and alternatives for the proposed anesthesia with the patient or authorized representative who has indicated his/her understanding and acceptance.   Dental advisory given  Plan Discussed with: Anesthesiologist, CRNA and Surgeon  Anesthesia Plan Comments: (2.9 by 2.7 cm thymic mass with symptoms of arm and shoulder pain H/O smoking  For resection with median sternotomy  Plan GA with art line  Plan GA  Kipp Brood, MD)       Anesthesia Quick Evaluation

## 2011-08-13 NOTE — Preoperative (Signed)
Beta Blockers   Reason not to administer Beta Blockers:Not Applicable. No home beta blockers 

## 2011-08-14 ENCOUNTER — Encounter (HOSPITAL_COMMUNITY): Payer: Self-pay | Admitting: Thoracic Surgery (Cardiothoracic Vascular Surgery)

## 2011-08-14 LAB — BASIC METABOLIC PANEL
CO2: 25 mEq/L (ref 19–32)
Calcium: 8.8 mg/dL (ref 8.4–10.5)
Chloride: 99 mEq/L (ref 96–112)
Sodium: 136 mEq/L (ref 135–145)

## 2011-08-14 LAB — BLOOD GAS, ARTERIAL
Bicarbonate: 25.1 mEq/L — ABNORMAL HIGH (ref 20.0–24.0)
TCO2: 26.4 mmol/L (ref 0–100)
pCO2 arterial: 40.2 mmHg (ref 35.0–45.0)
pH, Arterial: 7.412 (ref 7.350–7.450)

## 2011-08-14 LAB — CBC
HCT: 37.2 % — ABNORMAL LOW (ref 39.0–52.0)
Hemoglobin: 12.6 g/dL — ABNORMAL LOW (ref 13.0–17.0)
WBC: 12.2 10*3/uL — ABNORMAL HIGH (ref 4.0–10.5)

## 2011-08-14 MED ORDER — DIPHENHYDRAMINE HCL 12.5 MG/5ML PO ELIX
12.5000 mg | ORAL_SOLUTION | Freq: Four times a day (QID) | ORAL | Status: DC | PRN
  Filled 2011-08-14: qty 5

## 2011-08-14 MED ORDER — NALOXONE HCL 0.4 MG/ML IJ SOLN: 0.4000 mg | INTRAMUSCULAR | Status: DC | PRN

## 2011-08-14 MED ORDER — POTASSIUM CHLORIDE 10 MEQ/100ML IV SOLN
INTRAVENOUS | Status: AC
  Administered 2011-08-14: 10 meq via INTRAVENOUS
  Filled 2011-08-14: qty 300

## 2011-08-14 MED ORDER — DIPHENHYDRAMINE HCL 50 MG/ML IJ SOLN: 12.5000 mg | Freq: Four times a day (QID) | INTRAMUSCULAR | Status: DC | PRN

## 2011-08-14 MED ORDER — SODIUM CHLORIDE 0.9 % IJ SOLN: 9.0000 mL | INTRAMUSCULAR | Status: DC | PRN

## 2011-08-14 MED ORDER — PROMETHAZINE HCL 25 MG/ML IJ SOLN: 25.0000 mg | Freq: Four times a day (QID) | INTRAMUSCULAR | Status: DC | PRN

## 2011-08-14 MED ORDER — POTASSIUM CHLORIDE 10 MEQ/100ML IV SOLN
10.0000 meq | Freq: Every day | INTRAVENOUS | Status: DC | PRN
  Administered 2011-08-14 (×3): 10 meq via INTRAVENOUS

## 2011-08-14 MED ORDER — HYDROMORPHONE 0.3 MG/ML IV SOLN
INTRAVENOUS | Status: AC
  Filled 2011-08-14: qty 25

## 2011-08-14 MED ORDER — HYDROMORPHONE 0.3 MG/ML IV SOLN
INTRAVENOUS | Status: DC
  Administered 2011-08-14: 3.9 mg via INTRAVENOUS
  Administered 2011-08-14: 11:00:00 via INTRAVENOUS
  Administered 2011-08-14: 2.8 mg via INTRAVENOUS
  Administered 2011-08-15: 1.2 mg via INTRAVENOUS
  Administered 2011-08-15: 0.6 mg via INTRAVENOUS
  Administered 2011-08-15: 1.8 mg via INTRAVENOUS

## 2011-08-14 MED ORDER — ONDANSETRON HCL 4 MG/2ML IJ SOLN
4.0000 mg | Freq: Four times a day (QID) | INTRAMUSCULAR | Status: DC | PRN
  Administered 2011-08-14: 4 mg via INTRAVENOUS
  Filled 2011-08-14: qty 2

## 2011-08-14 MED ORDER — HYDROMORPHONE 0.3 MG/ML IV SOLN
INTRAVENOUS | Status: AC
  Administered 2011-08-14: 17:00:00
  Filled 2011-08-14: qty 25

## 2011-08-14 NOTE — Progress Notes (Signed)
Report given to Sharyl Nimrod, Charity fundraiser.  Pt condition stable.  Pain controlled with PCA, respirations even and unlabored.

## 2011-08-14 NOTE — Progress Notes (Signed)
1 Day Post-Op Procedure(s) (LRB): MEDIASTERNOTOMY (N/A) THYMECTOMY (N/A) Subjective: C/o incisional pain with deep breath Still some nausea but improving  Objective: Vital signs in last 24 hours: Temp:  [97.2 F (36.2 C)-99.8 F (37.7 C)] 99.1 F (37.3 C) (04/04 0400) Pulse Rate:  [55-110] 107  (04/04 0400) Cardiac Rhythm:  [-] Normal sinus rhythm;Sinus tachycardia (04/04 0400) Resp:  [14-22] 17  (04/04 0718) BP: (106-146)/(47-88) 121/60 mmHg (04/04 0400) SpO2:  [94 %-100 %] 94 % (04/04 0718) Weight:  [213 lb 6.5 oz (96.8 kg)] 213 lb 6.5 oz (96.8 kg) (04/03 2000)  Hemodynamic parameters for last 24 hours:    Intake/Output from previous day: 04/03 0701 - 04/04 0700 In: 3450 [I.V.:3250; IV Piggyback:200] Out: 78 [Drains:20; Blood:50; Chest Tube:8] Intake/Output this shift:    General appearance: alert and no distress Heart: regular rate and rhythm Lungs: diminished breath sounds bibasilar Abdomen: normal findings: soft, non-tender  Lab Results:  Basename 08/14/11 0415 08/11/11 1048  WBC 12.2* 6.7  HGB 12.6* 14.5  HCT 37.2* 44.4  PLT 203 265   BMET:  Basename 08/14/11 0415 08/11/11 1048  NA 136 142  K 3.4* 4.9  CL 99 106  CO2 25 27  GLUCOSE 121* 71  BUN 9 18  CREATININE 0.85 1.06  CALCIUM 8.8 10.0    PT/INR:  Basename 08/11/11 1048  LABPROT 13.8  INR 1.04   ABG    Component Value Date/Time   PHART 7.412 08/14/2011 0352   HCO3 25.1* 08/14/2011 0352   TCO2 26.4 08/14/2011 0352   ACIDBASEDEF 2.7* 08/11/2011 1048   O2SAT 95.6 08/14/2011 0352   CBG (last 3)  No results found for this basename: GLUCAP:3 in the last 72 hours  Assessment/Plan: S/P Procedure(s) (LRB): MEDIASTERNOTOMY (N/A) THYMECTOMY (N/A) POD #1 Thymectomy Doing well Nausea- zofran, phenergan, will change to dilaudid PCA to see if that helps at all Ambulate Hypokalemia- will follow for now,   LOS: 1 day    Dalexa Gentz C 08/14/2011

## 2011-08-14 NOTE — Anesthesia Postprocedure Evaluation (Signed)
  Anesthesia Post-op Note  Patient: Christian Ho  Procedure(s) Performed: Procedure(s) (LRB): MEDIASTERNOTOMY (N/A) THYMECTOMY (N/A)  Patient Location: PACU  Anesthesia Type: General  Level of Consciousness: awake, alert  and oriented  Airway and Oxygen Therapy: Patient Spontanous Breathing and Patient connected to nasal cannula oxygen  Post-op Pain: mild  Post-op Assessment: Post-op Vital signs reviewed and Patient's Cardiovascular Status Stable  Post-op Vital Signs: stable  Complications: No apparent anesthesia complications

## 2011-08-14 NOTE — Op Note (Signed)
NAMELENY, MOROZOV NO.:  [id number]  MEDICAL RECORD NO.:  [id number]  LOCATION:  3307                         FACILITY:  [facility]  PHYSICIAN:  Christian Ho, M.D.DATE OF BIRTH:  07/27/1990  DATE OF PROCEDURE:  08/13/2011 DATE OF DISCHARGE:                              OPERATIVE REPORT   PREOPERATIVE DIAGNOSIS:  Anterior mediastinal mass.  POSTOPERATIVE DIAGNOSIS:  Anterior mediastinal mass.  PROCEDURE:  Partial sternotomy, thymectomy.  SURGEON:  Christian Ho, M.D.  ASSISTANTDenny Peon Barrett.  ANESTHESIA:  General.  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  CLINICAL NOTE:  Christian Ho is a 21 year old gentleman who recently developed chest and shoulder pain.  His workup eventually included a CT of the chest, which showed an anterior mediastinal mass.  The patient was referred for surgical evaluation.  Options of continued radiographic followup versus surgical excision were discussed with the patient.  He wished to proceed with surgical excision.  He understood and accepted the risks of the procedure.  OPERATIVE NOTE:  Christian Ho was brought to the preop holding area on August 13, 2011.  There, Anesthesia placed an arterial blood pressure monitoring line and established intravenous access.  He was taken to the operating room, anesthetized, and intubated.  Intravenous antibiotics were administered.  PAS hose were placed for DVT prophylaxis.  The chest was prepped and draped in usual sterile fashion.  Incision was made over the upper portion of the sternum, was carried through the skin and subcutaneous tissue, and a partial sternotomy then was performed.  T- type incision was performed with the manubrium being divided and then the sternum being divided transversely in the first interspace below the sternomanubrial junction.  Hemostasis was achieved.   Retractor was placed.  The overlying tissue was dissected to expose the thymus.  The thymus was enlarged.  The dissection was started along the pericardial surface.  It was primarily done with electrocautery.  Larger vessels were doubly clipped and divided with scissors. More laterally in the vicinity of the phrenic nerves, the dissection was done using combination of sharp scissor dissection and blunt dissection.  A cautery was not used in the vicinity of the phrenic nerves.  The plane on the right side was developed initially. First from the inferior extent of the thymus gland was defined and separated from the surrounding fat- pad, then the dissection was carried along the under surface of the thymus.  In the lateral surface, the pleural space was entered during the dissection just below the level of the innominate.  The borders of thymus were less distinct.  There was extensive fatty tissue in this Area, near the phrenic nerve.  The dissection then was carried up on the superior pole of the thymus and that was dissected out.  There were large veins in this area that were clipped and divided. Next, the left upper pole of the thymus was dissected out and again large veins were doubly clipped and divided.  Next, carefully going back in completing the lateral aspect of the dissection and entire right lobe of the thymus was now  freed up.  There was a large central vein draining into the innominate, this was divided and then the left-sided dissection was carried out.  The left side of the thymus was relatively larger. There was no discrete mass, but did have a more mass-like quality and extended further laterally as well.  This was freed up in a similar fashion, taking care to ensure that all thymic tissue was removed and being particularly careful in the vicinity of the phrenic nerve.  After completing the lateral aspect, the final few posterior attachments were divided and the thymus  gland was removed and sent for permanent pathology.  A careful inspection was made for hemostasis.  A 19-French Blake drain was placed through a separate stab incision below the xiphoid and tunneled into the area of the wound.  The tip was placed within the right pleural space.  The wound was copiously irrigated with warm saline.  Hemostasis was achieved.  The sternum then was reapproximated using heavy-gauge double stainless steel wires.  The wires were placed to the interspaces above and below the transverse sternotomy on both sides and then through the manubrium itself.  The transverse wires were tightened first and then the manubrial wires were tightened.  There was a good approximation of the bony ends.  The wound was again copiously irrigated with warm saline.  The pectoralis fascia, subcutaneous tissue, and skin were closed in standard fashion.  All sponge, needle, instrument counts were correct at the end of the procedure.  The patient tolerated the procedure well and was taken from the operating room to the Postanesthetic Care Unit in good condition.     Christian Decent Dorris Ho, M.D.     SCH/MEDQ  D:  08/13/2011  T:  08/14/2011  Job:  [id number]

## 2011-08-14 NOTE — Progress Notes (Signed)
   CARE MANAGEMENT NOTE 08/14/2011  Patient:  Christian Ho, Christian Ho   Account Number:  [id number]  Date Initiated:  08/14/2011  Documentation initiated by:  [facility]  Subjective/Objective Assessment:   Post op removal of sternal mass.     Action/Plan:   PTA, PT INDEPENDENT, LIVES WITH PARENTS.   Anticipated DC Date:  08/18/2011   Anticipated DC Plan:  HOME/SELF CARE      DC Planning Services  CM consult      Choice offered to / List presented to:             Status of service:  In process, will continue to follow Medicare Important Message given?   (If response is "NO", the following Medicare IM given date fields will be blank) Date Medicare IM given:   Date Additional Medicare IM given:    Discharge Disposition:    Per UR Regulation:  Reviewed for med. necessity/level of care/duration of stay  If discussed at Long Length of Stay Meetings, dates discussed:    Comments:  08/14/11 Christian Tarnowski,RN,BSN 1530 MET WITH PT AND MOTHER TO DISCUSS DC PLANS.  MOM TO PROVIDE CARE AT DISCHARGE.  PT DENIES ANY DC NEEDS PRESENTLY.  WILL FOLLOW.  Phone #[phone]

## 2011-08-14 NOTE — Progress Notes (Signed)
Fentanyl PCA dc'ed per order.  26cc of fentanyl wasted in sink with Dimple Nanas, RN.   Dilaudid PCA initiated as ordered.

## 2011-08-14 NOTE — Progress Notes (Signed)
UR Completed.  Bernhard Koskinen Jane [phone] 08/14/2011  

## 2011-08-15 ENCOUNTER — Inpatient Hospital Stay (HOSPITAL_COMMUNITY): Payer: BC Managed Care – PPO

## 2011-08-15 LAB — CBC
Hemoglobin: 12.3 g/dL — ABNORMAL LOW (ref 13.0–17.0)
MCHC: 33.2 g/dL (ref 30.0–36.0)
RDW: 12.4 % (ref 11.5–15.5)

## 2011-08-15 LAB — BASIC METABOLIC PANEL
GFR calc Af Amer: >90 mL/min (ref >90)
GFR calc non Af Amer: >90 mL/min (ref >90)
Glucose, Bld: 111 mg/dL — ABNORMAL HIGH (ref 70–99)
Potassium: 3.4 mEq/L — ABNORMAL LOW (ref 3.5–5.1)
Sodium: 140 mEq/L (ref 135–145)

## 2011-08-15 MED ORDER — POTASSIUM CHLORIDE CRYS ER 20 MEQ PO TBCR
40.0000 meq | EXTENDED_RELEASE_TABLET | Freq: Two times a day (BID) | ORAL | Status: DC
  Administered 2011-08-15 – 2011-08-16 (×3): 40 meq via ORAL
  Filled 2011-08-15 (×4): qty 2

## 2011-08-15 MED ORDER — OXYCODONE-ACETAMINOPHEN 5-325 MG PO TABS: 1.0000 | ORAL_TABLET | ORAL | Status: AC | PRN

## 2011-08-15 MED ORDER — KETOROLAC TROMETHAMINE 30 MG/ML IJ SOLN
30.0000 mg | Freq: Four times a day (QID) | INTRAMUSCULAR | Status: DC | PRN
  Administered 2011-08-15 – 2011-08-16 (×2): 30 mg via INTRAVENOUS
  Filled 2011-08-15 (×2): qty 1

## 2011-08-15 MED ORDER — ACETAMINOPHEN 325 MG PO TABS
650.0000 mg | ORAL_TABLET | Freq: Four times a day (QID) | ORAL | Status: DC | PRN
  Administered 2011-08-15: 650 mg via ORAL
  Filled 2011-08-15: qty 2

## 2011-08-15 NOTE — Progress Notes (Addendum)
                    [address]            [place] [address]          [phone]     2 Days Post-Op Procedure(s) (LRB): MEDIASTERNOTOMY (N/A) THYMECTOMY (N/A)  Subjective: Still sore, but overall feels ok.  Breathing stable.  Marginal appetite, but no nausea today.  Objective: Vital signs in last 24 hours: Patient Vitals for the past 24 hrs:  BP Temp Temp src Pulse Resp SpO2  08/15/11 0310 - - - 113  21  94 %  08/15/11 0308 - - - - 19  94 %  08/15/11 0305 127/68 mmHg 98.8 F (37.1 C) Oral 126  19  94 %  08/15/11 0000 - - - - 19  92 %  08/14/11 2330 119/72 mmHg 98.8 F (37.1 C) Oral 98  17  92 %  08/14/11 1936 128/74 mmHg 99.2 F (37.3 C) Oral 116  20  94 %  08/14/11 1934 - - - - 20  94 %  08/14/11 1712 - - - - 20  95 %  08/14/11 1600 127/62 mmHg 98.7 F (37.1 C) Oral 89  16  92 %  08/14/11 1220 - - - - 20  95 %  08/14/11 1200 113/62 mmHg 98.9 F (37.2 C) Oral 99  22  93 %   Current Weight  08/13/11 96.8 kg (213 lb 6.5 oz)     Intake/Output from previous day: 04/04 0701 - 04/05 0700 In: 1867.5 [P.O.:120; I.V.:1447.5; IV Piggyback:300] Out: 1010 [Urine:1000; Drains:10]    PHYSICAL EXAM:  Heart: tachy, around 100s Lungs: clear Wound:clean and dry   Lab Results: CBC: Basename 08/15/11 0422 08/14/11 0415  WBC 10.5 12.2*  HGB 12.3* 12.6*  HCT 37.1* 37.2*  PLT 188 203   BMET:  Basename 08/15/11 0422 08/14/11 0415  NA 140 136  K 3.4* 3.4*  CL 106 99  CO2 26 25  GLUCOSE 111* 121*  BUN 6 9  CREATININE 0.79 0.85  CALCIUM 8.9 8.8    PT/INR: No results found for this basename: LABPROT,INR in the last 72 hours  CXR: Interval removal of mediastinal drain. Bibasilar atelectasis with  low volumes. No pneumothorax.   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 Encouraged po pain meds, wean and d/c PCA as tolerated. Ambulate,  continue pulm toilet. Replace K+. Possibly home this weekend if remains stable.   LOS: 2 days    COLLINS,GINA H 08/15/2011   Pt seen and examined. Agree with above. D/C PCA. Probably home in AM

## 2011-08-15 NOTE — Discharge Summary (Signed)
[address]            [place] [address]          [phone]         Discharge Summary  Name: Christian Ho DOB: 04-21-1991 21 y.o. 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 male who presents with the chief complaint of left shoulder pain. He says he has been having pain in his left shoulder on and off for about the last 8 or 9 months. He attributed this to working out. This mainly hurts in the pectoral area on the left side. He's had to curtail his workout regimen. He feels like the pain is deeper than the muscle. More recently he had an episode where he had some difficulty swallowing, that lasted a couple of days. He is also been noticing some shortness of breath sometimes when he lies flat and sometimes went active. He went to an urgent care one time when he was having some shortness of breath and the pain, chest x-ray at that time did not show any abnormality. Shortly thereafter he had a more severe episode and he went to the emergency room. A CT of the chest was done which showed a 2.9 x 2.4 cm mass in the anterior mediastinum. He saw Dr. Sudie Bailey. Thyroid panel was done as were alpha-fetoprotein and beta hCG. Both AFP and hCG were within normal limits. He was referred to Dr. Dorris Fetch for surgical evaluation, and he recommended resection of the mass at this time.  All risks, benefits and alternatives of surgery were explained in detail, and the patient agreed to proceed.     Hospital Course:  The patient was admitted to [facility] on 08/13/2011. The patient was taken to the operating room and underwent the above procedure.    The postoperative course has generally been uneventful.  He initially had some nausea, but this  has since resolved.  He is tolerating a regular diet.  His chest tube has been removed and a followup chest x-ray has remained stable.  He is ambulating in the hall without difficulty.  His path revealed thymic hyperplasia, with no atypia or malignancy.  We anticipate discharge home on 08/16/2011 provided no acute changes occur.   Recent vital signs:  Filed Vitals:   08/15/11 1600  BP: 129/67  Pulse:   Temp: 98.6 F (37 C)  Resp:     Recent laboratory studies:  CBC: Basename 08/15/11 0422 08/14/11 0415  WBC 10.5 12.2*  HGB 12.3* 12.6*  HCT 37.1* 37.2*  PLT 188 203   BMET:  Basename 08/15/11 0422 08/14/11 0415  NA 140 136  K 3.4* 3.4*  CL 106 99  CO2 26 25  GLUCOSE 111* 121*  BUN 6 9  CREATININE 0.79 0.85  CALCIUM 8.9 8.8    PT/INR: No results found for this basename: LABPROT,INR in the last 72 hours  Discharge Medications:   Medication List  As of 08/15/2011  5:22 PM   STOP taking these medications         traMADol 50 MG tablet         TAKE these medications  oxyCODONE-acetaminophen 5-325 MG per tablet   Commonly known as: PERCOCET   Take 1-2 tablets by mouth every 4 (four) hours as needed for pain.            Discharge Instructions:  The patient is to refrain from driving, heavy lifting or strenuous activity.  May shower daily and clean incisions with soap and water.  May resume regular diet.    Follow-up Information    Follow up with Tarez Bowns C, MD in 3 weeks. (Office will call with appointment)    Contact information:   [address] [place] [address] [phone]           Adella Hare 08/15/2011, 5:22 PM

## 2011-08-16 ENCOUNTER — Inpatient Hospital Stay (HOSPITAL_COMMUNITY): Payer: BC Managed Care – PPO

## 2011-08-16 MED ORDER — HYDROCODONE-ACETAMINOPHEN 5-325 MG PO TABS: 1.0000 | ORAL_TABLET | ORAL | Status: DC | PRN

## 2011-08-16 MED ORDER — TRAMADOL HCL 50 MG PO TABS
100.0000 mg | ORAL_TABLET | Freq: Four times a day (QID) | ORAL | Status: DC | PRN
  Administered 2011-08-16: 100 mg via ORAL
  Filled 2011-08-16: qty 2

## 2011-08-16 MED ORDER — TRAMADOL HCL 50 MG PO TABS: 100.0000 mg | ORAL_TABLET | Freq: Four times a day (QID) | ORAL | Status: AC | PRN

## 2011-08-16 NOTE — Progress Notes (Signed)
Pt d/c home with mom and girlfriend, d/c instructions, f/u appt., Prescriptions given to patient. Beryle Quant

## 2011-08-16 NOTE — Progress Notes (Addendum)
                    [address]            [place] [address]          [phone]     3 Days Post-Op Procedure(s) (LRB): MEDIASTERNOTOMY (N/A) THYMECTOMY (N/A)  Subjective: Still having a lot of pain.  Feels that Percocet is not helping at all, and still taking regular doses of Toradol.  Otherwise feels well.  Objective: Vital signs in last 24 hours: Patient Vitals for the past 24 hrs:  BP Temp Temp src Pulse Resp SpO2  08/16/11 0719 135/69 mmHg 98.3 F (36.8 C) Oral 87  17  98 %  08/16/11 0713 135/69 mmHg - - - 22  -  08/16/11 0420 - - - - 12  -  08/16/11 0400 121/73 mmHg 97.6 F (36.4 C) Oral 84  16  97 %  08/16/11 0014 120/64 mmHg 98.6 F (37 C) Oral 91  18  98 %  08/15/11 1955 124/75 mmHg 98.5 F (36.9 C) Oral 115  - 99 %  08/15/11 1600 129/67 mmHg 98.6 F (37 C) Oral - - -  08/15/11 1227 - - - - 18  96 %  08/15/11 1100 121/72 mmHg 98.9 F (37.2 C) Oral - - -  08/15/11 0809 - - - - 21  98 %   Current Weight  08/13/11 96.8 kg (213 lb 6.5 oz)     Intake/Output from previous day: 04/05 0701 - 04/06 0700 In: 396.3 [P.O.:362; I.V.:34.3] Out: -     PHYSICAL EXAM:  Heart: RRR Lungs: clear Wound: clean and dry   Lab Results: CBC: Basename 08/15/11 0422 08/14/11 0415  WBC 10.5 12.2*  HGB 12.3* 12.6*  HCT 37.1* 37.2*  PLT 188 203   BMET:  Basename 08/15/11 0422 08/14/11 0415  NA 140 136  K 3.4* 3.4*  CL 106 99  CO2 26 25  GLUCOSE 111* 121*  BUN 6 9  CREATININE 0.79 0.85  CALCIUM 8.9 8.8    CXR:  Improved lung volumes. Continued small bilateral pleural effusions  and perihilar/basilar atelectasis.    Assessment/Plan: S/P Procedure(s) (LRB): MEDIASTERNOTOMY (N/A) THYMECTOMY (N/A) Doing well post-op except pain control issues.  Will change to po Vicodin and see if that works better.  Hopefully home once we find a pain regimen that works for him.   LOS: 3 days    COLLINS,GINA H 08/16/2011  Patient seen and examined. Agree  with above Home when pain management issues resolved

## 2011-08-16 NOTE — Discharge Instructions (Signed)
No driving, heavy lifting or strenuous activity.  Continue regular diet.  Shower daily and clean incisions with soap and water.  Care of your chest incision  Tell your caregiver right away if you notice clicking in your chest (sternum).  Support your chest with a pillow or your arms when you take deep breaths and cough.  Follow your caregiver's instructions about when you can bathe or swim.  Protect your incision from sunlight during the first year to keep the scar from getting dark.  Tell your caregiver if you notice:  Increased tenderness of your incision.  Increased redness or swelling around your incision.  Drainage or pus from your incision

## 2011-08-29 ENCOUNTER — Ambulatory Visit: Payer: Self-pay

## 2011-08-29 ENCOUNTER — Other Ambulatory Visit: Payer: Self-pay

## 2011-09-09 ENCOUNTER — Other Ambulatory Visit: Payer: Self-pay | Admitting: Thoracic Surgery (Cardiothoracic Vascular Surgery)

## 2011-09-09 DIAGNOSIS — R222 Localized swelling, mass and lump, trunk: Secondary | ICD-10-CM

## 2011-09-10 ENCOUNTER — Ambulatory Visit (INDEPENDENT_AMBULATORY_CARE_PROVIDER_SITE_OTHER): Payer: Self-pay | Admitting: Thoracic Surgery (Cardiothoracic Vascular Surgery)

## 2011-09-10 ENCOUNTER — Ambulatory Visit
Admission: RE | Admit: 2011-09-10 | Discharge: 2011-09-10 | Disposition: A | Discharge status: Home / Self Care | Payer: BC Managed Care – PPO | Source: Ambulatory Visit | Attending: Thoracic Surgery (Cardiothoracic Vascular Surgery) | Admitting: Thoracic Surgery (Cardiothoracic Vascular Surgery)

## 2011-09-10 ENCOUNTER — Encounter: Payer: Self-pay | Admitting: Thoracic Surgery (Cardiothoracic Vascular Surgery)

## 2011-09-10 VITALS — BP 122/73 | HR 68 | Resp 16 | Ht 70.5 in | Wt 215.0 lb

## 2011-09-10 DIAGNOSIS — Z09 Encounter for follow-up examination after completed treatment for conditions other than malignant neoplasm: Secondary | ICD-10-CM

## 2011-09-10 DIAGNOSIS — D150 Benign neoplasm of thymus: Secondary | ICD-10-CM

## 2011-09-10 DIAGNOSIS — R222 Localized swelling, mass and lump, trunk: Secondary | ICD-10-CM

## 2011-09-10 NOTE — Progress Notes (Signed)
  HPI:  Patient returns for routine postoperative follow-up having undergone thymectomy via a partial upper sternotomy. He did well postoperatively and has continued do well since discharge. He's not taken any pain medication in the past 2 half weeks. He is anxious to increase his activities.  Past Medical History  Diagnosis Date  . Neuromuscular disorder     nerve pain- L shoulder- ? related mass in chest    . Shortness of breath     sob, early March, with shoulder pain      Current Outpatient Prescriptions  Medication Sig Dispense Refill  . oxycodone (OXY-IR) 5 MG capsule Take 5 mg by mouth every 4 (four) hours as needed.        Physical Exam BP 122/73  Pulse 68  Resp 16  Ht 5' 10.5" (1.791 m)  Wt 215 lb (97.523 kg)  BMI 30.41 kg/m2  SpO2 98% Well-appearing 21 year old male in no acute distress Incision clean dry and intact Sternum stable  Diagnostic Tests: Chest x-ray shows good aeration of the lungs bilaterally  Impression: 21 year old status post thymectomy for a mediastinal mass which turned out to be thymic hyperplasia.  He is doing extremely well at this point in time and having minimal discomfort. He may begin driving. He still not to lift any objects weighing greater than 10 pounds for another 2 weeks, otherwise his activities are unrestricted. He was encouraged to build into his activities gradually.  Plan: Return in one year with CT of the chest for final followup visit

## 2012-09-20 ENCOUNTER — Other Ambulatory Visit: Payer: Self-pay

## 2012-09-20 DIAGNOSIS — D4989 Neoplasm of unspecified behavior of other specified sites: Secondary | ICD-10-CM

## 2012-10-19 ENCOUNTER — Ambulatory Visit: Payer: BC Managed Care – PPO | Admitting: Thoracic Surgery (Cardiothoracic Vascular Surgery)

## 2012-10-19 ENCOUNTER — Other Ambulatory Visit: Payer: BC Managed Care – PPO

## 2012-10-20 ENCOUNTER — Other Ambulatory Visit: Payer: BC Managed Care – PPO

## 2012-10-26 ENCOUNTER — Other Ambulatory Visit: Payer: BC Managed Care – PPO

## 2012-10-26 ENCOUNTER — Ambulatory Visit: Payer: BC Managed Care – PPO | Admitting: Thoracic Surgery (Cardiothoracic Vascular Surgery)

## 2012-11-16 ENCOUNTER — Ambulatory Visit: Payer: BC Managed Care – PPO | Admitting: Thoracic Surgery (Cardiothoracic Vascular Surgery)

## 2013-10-06 IMAGING — CR DG CHEST 1V PORT
1 series · 1 of 1 positions shown · non-contrast
Comparison: 08/13/2011

CLINICAL DATA: Post thymectomy.  Shortness of breath.

PORTABLE CHEST - 1 VIEW

[view not recorded]
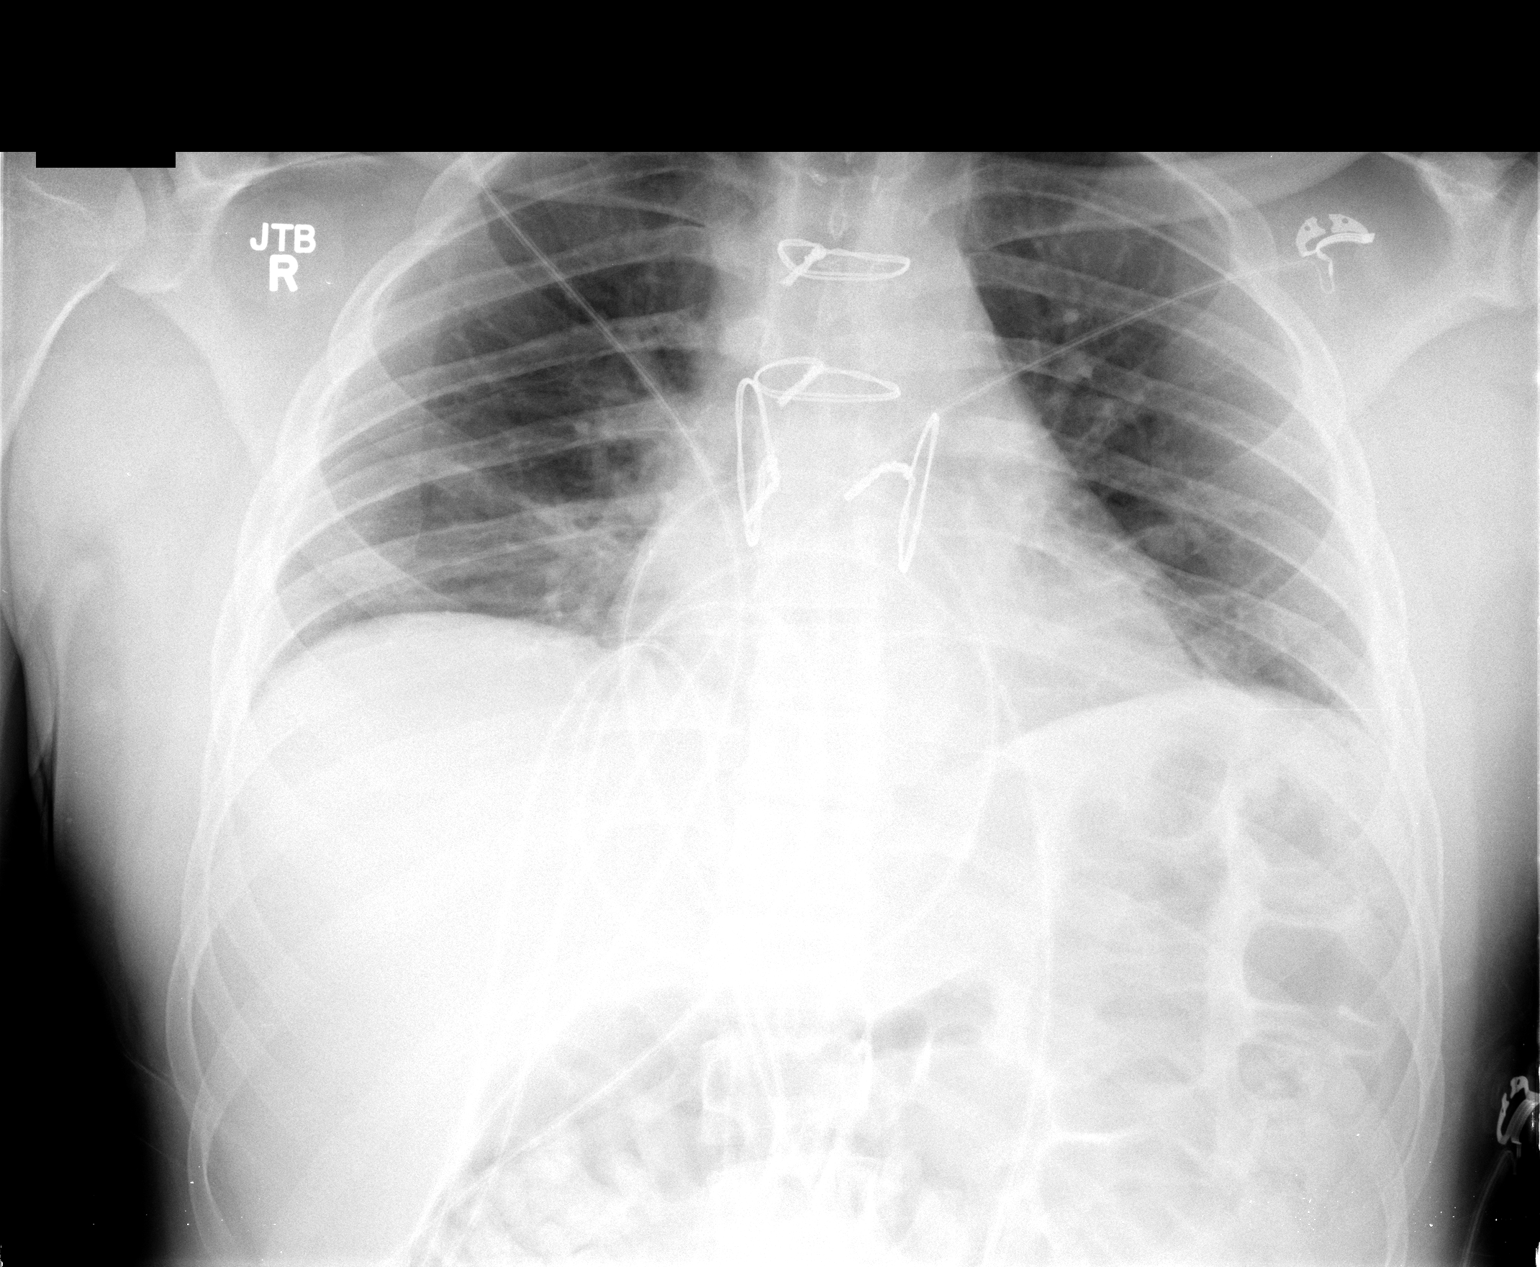

[1 of 1 positions shown; findings below may reference images not displayed]

FINDINGS: Interval removal of mediastinal drain.  No pneumothorax.
Low lung volumes with bibasilar atelectasis and accentuation of the
heart size.
IMPRESSION: Interval removal of mediastinal drain.  Bibasilar atelectasis with
low volumes.  No pneumothorax.

## 2018-09-16 DIAGNOSIS — Z0000 Encounter for general adult medical examination without abnormal findings: Secondary | ICD-10-CM | POA: Diagnosis not present

## 2018-11-24 DIAGNOSIS — R942 Abnormal results of pulmonary function studies: Secondary | ICD-10-CM | POA: Diagnosis not present

## 2018-11-24 DIAGNOSIS — D384 Neoplasm of uncertain behavior of thymus: Secondary | ICD-10-CM | POA: Diagnosis not present

## 2019-06-03 ENCOUNTER — Other Ambulatory Visit: Payer: Self-pay | Admitting: *Deleted

## 2019-06-03 DIAGNOSIS — R0789 Other chest pain: Secondary | ICD-10-CM

## 2019-06-14 ENCOUNTER — Institutional Professional Consult (permissible substitution) (INDEPENDENT_AMBULATORY_CARE_PROVIDER_SITE_OTHER): Payer: BC Managed Care – PPO | Admitting: Thoracic Surgery (Cardiothoracic Vascular Surgery)

## 2019-06-14 ENCOUNTER — Other Ambulatory Visit: Payer: Self-pay | Admitting: *Deleted

## 2019-06-14 ENCOUNTER — Encounter: Payer: Self-pay | Admitting: *Deleted

## 2019-06-14 ENCOUNTER — Ambulatory Visit
Admission: RE | Admit: 2019-06-14 | Discharge: 2019-06-14 | Disposition: A | Discharge status: Home / Self Care | Payer: BC Managed Care – PPO | Source: Ambulatory Visit | Attending: Thoracic Surgery (Cardiothoracic Vascular Surgery) | Admitting: Thoracic Surgery (Cardiothoracic Vascular Surgery)

## 2019-06-14 ENCOUNTER — Other Ambulatory Visit: Payer: Self-pay

## 2019-06-14 VITALS — BP 124/88 | HR 70 | Temp 97.9°F | Resp 20 | Ht 70.5 in | Wt 260.0 lb

## 2019-06-14 DIAGNOSIS — R0789 Other chest pain: Secondary | ICD-10-CM

## 2019-06-14 DIAGNOSIS — R072 Precordial pain: Secondary | ICD-10-CM | POA: Diagnosis not present

## 2019-06-14 NOTE — H&P (View-Only) (Signed)
PCP is Ernestene Kiel, MD Referring Provider is Ernestene Kiel, MD  Chief Complaint  Patient presents with  . Consult    HX of thymectomy, c/o sternal wire pain x1 week, CXR prior     HPI: Stephanos Tiet presents with a complaint of sternal pain.  Christian Ho is a 29 year old man who underwent partial sternotomy for thymic resection in 2013.  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  He did well postoperatively.  He continued to do well for about 7 years until about a month ago when he started to notice a sensation of something poking him in his chest.  This is just to the right of the sternum and he can feel a wire and that area.  He will notice this when he moves or turns in a certain way and there are ways he can move to cause the pain to resolve.  He says it feels like he is being poked with a coat hanger.  Does not have any fevers or chills.  He does not have any clicking or popping or grinding sensations.   Past Medical History:  Diagnosis Date  . Neuromuscular disorder    nerve pain- L shoulder- ? related mass in chest    . Shortness of breath    sob, early March, with shoulder pain     Past Surgical History:  Procedure Laterality Date  . Left club foot surgery in 1991    . MEDIASTERNOTOMY  08/13/2011   Procedure: MEDIASTERNOTOMY;  Surgeon: Melrose Nakayama, MD;  Location: [facility];  Service: Thoracic;  Laterality: N/A;  . right wrist ORIF      Family History  Problem Relation Age of Onset  . Breast cancer Maternal Grandmother   . Lung cancer Maternal Grandmother   . Anesthesia problems Neg Hx     Social History Social History   Tobacco Use  . Smoking status: Former Smoker    Types: Cigarettes    Quit date: 05/12/2010    Years since quitting: 9.0  Substance Use Topics  . Alcohol use: No  . Drug use: No    No current outpatient medications on file.   No current facility-administered  medications for this visit.    No Known Allergies  Review of Systems  Cardiovascular: Positive for chest pain (Sternal).  All other systems reviewed and are negative.   BP 124/88   Pulse 70   Temp 97.9 F (36.6 C) (Skin)   Resp 20   Ht 5' 10.5" (1.791 m)   Wt 260 lb (117.9 kg)   SpO2 98% Comment: RA  BMI 36.78 kg/m  Physical Exam Vitals reviewed.  Constitutional:      General: He is not in acute distress. HENT:     Head: Normocephalic and atraumatic.  Eyes:     General: No scleral icterus.    Extraocular Movements: Extraocular movements intact.  Cardiovascular:     Rate and Rhythm: Normal rate and regular rhythm.     Heart sounds: No murmur.  Pulmonary:     Effort: Pulmonary effort is normal. No respiratory distress.     Breath sounds: Normal breath sounds. No wheezing or rales.  Abdominal:     General: There is no distension.     Palpations: Abdomen is soft.  Musculoskeletal:     Cervical back: Neck supple.  Skin:    General: Skin is warm and dry.     Comments:  Sternal incision well-healed.  No movement with coughing.  Mild tenderness to deep palpation inferior right edge of the incision.  Neurological:     General: No focal deficit present.     Mental Status: He is alert and oriented to person, place, and time.     Cranial Nerves: No cranial nerve deficit.     Motor: No weakness.    Diagnostic Tests: CHEST - 2 VIEW  COMPARISON:  PA and lateral chest 09/10/2011.  FINDINGS: Median sternotomy wires are intact and unchanged in position. Lungs are clear. Heart size is normal. No pneumothorax or pleural fluid. No acute or focal bony abnormality.  IMPRESSION: Median sternotomy wires are intact and unchanged.  No acute disease.   Electronically Signed   By: Inge Rise M.D. I personally reviewed the chest x-ray images and concur with the findings noted above.  Impression: Lorna Stahlberg is a 29 year old man who underwent partial sternotomy for  thymectomy for what turned out to be thymic hyperplasia in 2013.  He has done well for 7 years but recently started having pain around his sternotomy incision.  He describes this as a sensation like he is being poked with a coat hanger.  It is provoked by movement in certain directions and also relieved by other movements.  In all likelihood this is related to his sternal wires.  His chest x-ray does not show any fractured or displaced sternal wires.  His exam is consistent with a well-healed sternum.  I think if he had nonunion it would have shown up long before now.  I offered him the option of sternal wire removal.  We can do this in the operating room.  He can be done with either sedation and local or general anesthesia.  This would be an outpatient procedure.  I informed him of the indications, risk, benefits, and alternatives.  He understands the risks include wound infection as well as the possibility of other unforeseeable complications.  He understands that he is not at risk for any harm if we leave the wires alone.  He wishes to go ahead with sternal wire removal as soon as possible.  Plan: Sternal wire removal on Friday, 07/01/2019.  Hopefully will be able to proceed with this as he will not require an inpatient bed.  He understands there is a possibility that Covid related issues could cause cancellation or delay.  Melrose Nakayama, MD [facility] [phone]

## 2019-06-14 NOTE — Progress Notes (Signed)
PCP is Ernestene Kiel, MD Referring Provider is Ernestene Kiel, MD  Chief Complaint  Patient presents with  . Consult    HX of thymectomy, c/o sternal wire pain x1 week, CXR prior     HPI: Christian Ho presents with a complaint of sternal pain.  Christian Ho is a 29 year old man who underwent partial sternotomy for thymic resection in 2013.  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  He did well postoperatively.  He continued to do well for about 7 years until about a month ago when he started to notice a sensation of something poking him in his chest.  This is just to the right of the sternum and he can feel a wire and that area.  He will notice this when he moves or turns in a certain way and there are ways he can move to cause the pain to resolve.  He says it feels like he is being poked with a coat hanger.  Does not have any fevers or chills.  He does not have any clicking or popping or grinding sensations.   Past Medical History:  Diagnosis Date  . Neuromuscular disorder    nerve pain- L shoulder- ? related mass in chest    . Shortness of breath    sob, early March, with shoulder pain     Past Surgical History:  Procedure Laterality Date  . Left club foot surgery in 1991    . MEDIASTERNOTOMY  08/13/2011   Procedure: MEDIASTERNOTOMY;  Surgeon: Melrose Nakayama, MD;  Location: [place];  Service: Thoracic;  Laterality: N/A;  . right wrist ORIF      Family History  Problem Relation Age of Onset  . Breast cancer Maternal Grandmother   . Lung cancer Maternal Grandmother   . Anesthesia problems Neg Hx     Social History Social History   Tobacco Use  . Smoking status: Former Smoker    Types: Cigarettes    Quit date: 05/12/2010    Years since quitting: 9.0  Substance Use Topics  . Alcohol use: No  . Drug use: No    No current outpatient medications on file.   No current facility-administered  medications for this visit.    No Known Allergies  Review of Systems  Cardiovascular: Positive for chest pain (Sternal).  All other systems reviewed and are negative.   BP 124/88   Pulse 70   Temp 97.9 F (36.6 C) (Skin)   Resp 20   Ht 5' 10.5" (1.791 m)   Wt 260 lb (117.9 kg)   SpO2 98% Comment: RA  BMI 36.78 kg/m  Physical Exam Vitals reviewed.  Constitutional:      General: He is not in acute distress. HENT:     Head: Normocephalic and atraumatic.  Eyes:     General: No scleral icterus.    Extraocular Movements: Extraocular movements intact.  Cardiovascular:     Rate and Rhythm: Normal rate and regular rhythm.     Heart sounds: No murmur.  Pulmonary:     Effort: Pulmonary effort is normal. No respiratory distress.     Breath sounds: Normal breath sounds. No wheezing or rales.  Abdominal:     General: There is no distension.     Palpations: Abdomen is soft.  Musculoskeletal:     Cervical back: Neck supple.  Skin:    General: Skin is warm and dry.     Comments:  Sternal incision well-healed.  No movement with coughing.  Mild tenderness to deep palpation inferior right edge of the incision.  Neurological:     General: No focal deficit present.     Mental Status: He is alert and oriented to person, place, and time.     Cranial Nerves: No cranial nerve deficit.     Motor: No weakness.    Diagnostic Tests: CHEST - 2 VIEW  COMPARISON:  PA and lateral chest 09/10/2011.  FINDINGS: Median sternotomy wires are intact and unchanged in position. Lungs are clear. Heart size is normal. No pneumothorax or pleural fluid. No acute or focal bony abnormality.  IMPRESSION: Median sternotomy wires are intact and unchanged.  No acute disease.   Electronically Signed   By: Inge Rise M.D. I personally reviewed the chest x-ray images and concur with the findings noted above.  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  He has done well for 7 years but recently started having pain around his sternotomy incision.  He describes this as a sensation like he is being poked with a coat hanger.  It is provoked by movement in certain directions and also relieved by other movements.  In all likelihood this is related to his sternal wires.  His chest x-ray does not show any fractured or displaced sternal wires.  His exam is consistent with a well-healed sternum.  I think if he had nonunion it would have shown up long before now.  I offered him the option of sternal wire removal.  We can do this in the operating room.  He can be done with either sedation and local or general anesthesia.  This would be an outpatient procedure.  I informed him of the indications, risk, benefits, and alternatives.  He understands the risks include wound infection as well as the possibility of other unforeseeable complications.  He understands that he is not at risk for any harm if we leave the wires alone.  He wishes to go ahead with sternal wire removal as soon as possible.  Plan: Sternal wire removal on Friday, 07/01/2019.  Hopefully will be able to proceed with this as he will not require an inpatient bed.  He understands there is a possibility that Covid related issues could cause cancellation or delay.  Melrose Nakayama, MD [facility] [phone]

## 2019-06-27 ENCOUNTER — Encounter (HOSPITAL_COMMUNITY): Payer: Self-pay

## 2019-06-27 NOTE — Progress Notes (Signed)
[facility] [place], [address] AT [address] [place] [address] Phone: [phone] Fax: [phone]      Your procedure is scheduled on Friday, July 01, 2019.  Report to [facility] Main Entrance "A" at 5:30 A.M., and check in at the Admitting office.  Call this number if you have problems the morning of surgery:  [phone]  Call [phone] if you have any questions prior to your surgery date Monday-Friday 8am-4pm    Remember:  Do not eat or drink after midnight the night before your surgery     Take these medicines the morning of surgery with A SIP OF WATER:  NONE  7 days prior to surgery STOP taking any Aspirin (unless otherwise instructed by your surgeon), Aleve, Naproxen, Ibuprofen, Motrin, Advil, Goody's, BC's, all herbal medications, fish oil, and all vitamins.    The Morning of Surgery  Do not wear jewelry.  Do not wear lotions, powders, colognes, or deodorant  Men may shave face and neck.  Do not bring valuables to the hospital.  [facility] is not responsible for any belongings or valuables.  If you are a smoker, DO NOT Smoke 24 hours prior to surgery  If you wear a CPAP at night please bring your mask the morning of surgery   Remember that you must have someone to transport you home after your surgery, and remain with you for 24 hours if you are discharged the same day.   Please bring cases for contacts, glasses, hearing aids, dentures or bridgework because it cannot be worn into surgery.    Leave your suitcase in the car.  After surgery it may be brought to your room.  For patients admitted to the hospital, discharge time will be determined by your treatment team.  Patients discharged the day of surgery will not be allowed to drive home.    Special instructions:   [place]- Preparing For Surgery  Before surgery, you can play an important role. Because  skin is not sterile, your skin needs to be as free of germs as possible. You can reduce the number of germs on your skin by washing with CHG (chlorahexidine gluconate) Soap before surgery.  CHG is an antiseptic cleaner which kills germs and bonds with the skin to continue killing germs even after washing.    Oral Hygiene is also important to reduce your risk of infection.  Remember - BRUSH YOUR TEETH THE MORNING OF SURGERY WITH YOUR REGULAR TOOTHPASTE  Please do not use if you have an allergy to CHG or antibacterial soaps. If your skin becomes reddened/irritated stop using the CHG.  Do not shave (including legs and underarms) for at least 48 hours prior to first CHG shower. It is OK to shave your face.  Please follow these instructions carefully.   1. Shower the NIGHT BEFORE SURGERY and the MORNING OF SURGERY with CHG Soap.   2. If you chose to wash your hair, wash your hair first as usual with your normal shampoo.  3. After you shampoo, rinse your hair and body thoroughly to remove the shampoo.  4. Use CHG as you would any other liquid soap. You can apply CHG directly to the skin and wash gently with a scrungie or a clean washcloth.   5. Apply the CHG Soap to your body ONLY FROM THE NECK DOWN.  Do not use on open wounds or open sores.  Avoid contact with your eyes, ears, mouth and genitals (private parts). Wash Face and genitals (private parts)  with your normal soap.   6. Wash thoroughly, paying special attention to the area where your surgery will be performed.  7. Thoroughly rinse your body with warm water from the neck down.  8. DO NOT shower/wash with your normal soap after using and rinsing off the CHG Soap.  9. Pat yourself dry with a CLEAN TOWEL.  10. Wear CLEAN PAJAMAS to bed the night before surgery, wear comfortable clothes the morning of surgery  11. Place CLEAN SHEETS on your bed the night of your first shower and DO NOT SLEEP WITH PETS.    Day of Surgery:  Please  shower the morning of surgery with the CHG soap Do not apply any deodorants/lotions. Please wear clean clothes to the hospital/surgery center.   Remember to brush your teeth WITH YOUR REGULAR TOOTHPASTE.   Please read over the following fact sheets that you were given.

## 2019-06-28 ENCOUNTER — Other Ambulatory Visit: Payer: Self-pay

## 2019-06-28 ENCOUNTER — Other Ambulatory Visit (HOSPITAL_COMMUNITY)
Admission: RE | Admit: 2019-06-28 | Discharge: 2019-06-28 | Disposition: A | Discharge status: Home / Self Care | Payer: BC Managed Care – PPO | Source: Ambulatory Visit | Attending: Thoracic Surgery (Cardiothoracic Vascular Surgery) | Admitting: Thoracic Surgery (Cardiothoracic Vascular Surgery)

## 2019-06-28 ENCOUNTER — Encounter (HOSPITAL_COMMUNITY)
Admission: RE | Admit: 2019-06-28 | Discharge: 2019-06-28 | Disposition: A | Discharge status: Home / Self Care | Payer: BC Managed Care – PPO | Source: Ambulatory Visit | Attending: Thoracic Surgery (Cardiothoracic Vascular Surgery) | Admitting: Thoracic Surgery (Cardiothoracic Vascular Surgery)

## 2019-06-28 ENCOUNTER — Encounter (HOSPITAL_COMMUNITY): Payer: Self-pay

## 2019-06-28 DIAGNOSIS — Z01818 Encounter for other preprocedural examination: Secondary | ICD-10-CM | POA: Insufficient documentation

## 2019-06-28 DIAGNOSIS — R0789 Other chest pain: Secondary | ICD-10-CM

## 2019-06-28 DIAGNOSIS — Z20822 Contact with and (suspected) exposure to covid-19: Secondary | ICD-10-CM | POA: Insufficient documentation

## 2019-06-28 LAB — SARS CORONAVIRUS 2 (TAT 6-24 HRS): SARS Coronavirus 2: NEGATIVE

## 2019-06-28 LAB — CBC
HCT: 48.1 % (ref 39.0–52.0)
Hemoglobin: 15.5 g/dL (ref 13.0–17.0)
MCH: 27.8 pg (ref 26.0–34.0)
MCHC: 32.2 g/dL (ref 30.0–36.0)
MCV: 86.4 fL (ref 80.0–100.0)
Platelets: 246 10*3/uL (ref 150–400)
RBC: 5.57 MIL/uL (ref 4.22–5.81)
RDW: 12.2 % (ref 11.5–15.5)
WBC: 5.9 10*3/uL (ref 4.0–10.5)
nRBC: 0 % (ref 0.0–0.2)

## 2019-06-28 LAB — COMPREHENSIVE METABOLIC PANEL
ALT: 28 U/L (ref 0–44)
AST: 34 U/L (ref 15–41)
Albumin: 4.3 g/dL (ref 3.5–5.0)
Alkaline Phosphatase: 79 U/L (ref 38–126)
Anion gap: 10 (ref 5–15)
BUN: 9 mg/dL (ref 6–20)
CO2: 26 mmol/L (ref 22–32)
Calcium: 9.6 mg/dL (ref 8.9–10.3)
Chloride: 102 mmol/L (ref 98–111)
Creatinine, Ser: 1.24 mg/dL (ref 0.61–1.24)
GFR calc Af Amer: >60 mL/min (ref >60)
GFR calc non Af Amer: >60 mL/min (ref >60)
Glucose, Bld: 92 mg/dL (ref 70–99)
Potassium: 3.8 mmol/L (ref 3.5–5.1)
Sodium: 138 mmol/L (ref 135–145)
Total Bilirubin: 1.6 mg/dL — ABNORMAL HIGH (ref 0.3–1.2)
Total Protein: 7.1 g/dL (ref 6.5–8.1)

## 2019-06-28 LAB — SURGICAL PCR SCREEN
MRSA, PCR: NEGATIVE
Staphylococcus aureus: NEGATIVE

## 2019-06-28 LAB — TYPE AND SCREEN
ABO/RH(D): O NEG
Antibody Screen: NEGATIVE

## 2019-06-28 LAB — PROTIME-INR
INR: 1 (ref 0.8–1.2)
Prothrombin Time: 13.5 seconds (ref 11.4–15.2)

## 2019-06-28 LAB — APTT: aPTT: 31 seconds (ref 24–36)

## 2019-06-28 NOTE — Progress Notes (Signed)
PCP - Dr. Ernestene Kiel Cardiologist - denies  PPM/ICD - denies  Chest x-ray - 06/14/2019 EKG - 06/28/2019 Stress Test - denies ECHO - denies Cardiac Cath - denies  Sleep Study - denies CPAP - N/A  DM: denies  Blood Thinner Instructions: N/A Aspirin Instructions: N/A  ERAS Protcol - No  COVID TEST- Test pending, 06/28/2019. Patient verbalized understanding of self-quarantine instructions.   Anesthesia review: No  Patient denies shortness of breath, fever, cough and chest pain at PAT appointment   All instructions explained to the patient, with a verbal understanding of the material. Patient agrees to go over the instructions while at home for a better understanding. Patient also instructed to self quarantine after being tested for COVID-19. The opportunity to ask questions was provided.

## 2019-06-29 ENCOUNTER — Other Ambulatory Visit (HOSPITAL_COMMUNITY): Payer: BC Managed Care – PPO

## 2019-06-30 NOTE — Anesthesia Preprocedure Evaluation (Addendum)
Anesthesia Evaluation  Patient identified by MRN, date of birth, ID band Patient awake    Reviewed: Allergy & Precautions, H&P , NPO status , Patient's Chart, lab work & pertinent test results, reviewed documented beta blocker date and time   Airway Mallampati: I  TM Distance: >3 FB     Dental  (+) Teeth Intact, Dental Advisory Given   Pulmonary former smoker,  S/p sternotomy for thymectomy   breath sounds clear to auscultation       Cardiovascular Exercise Tolerance: Good negative cardio ROS Normal cardiovascular exam Rhythm:Regular Rate:Normal     Neuro/Psych  Neuromuscular disease    GI/Hepatic negative GI ROS, Neg liver ROS,   Endo/Other  negative endocrine ROS  Renal/GU negative Renal ROS     Musculoskeletal   Abdominal   Peds  Hematology   Anesthesia Other Findings   Reproductive/Obstetrics                            Lab Results  Component Value Date   WBC 5.9 06/28/2019   HGB 15.5 06/28/2019   HCT 48.1 06/28/2019   MCV 86.4 06/28/2019   PLT 246 06/28/2019   Lab Results  Component Value Date   CREATININE 1.24 06/28/2019   BUN 9 06/28/2019   NA 138 06/28/2019   K 3.8 06/28/2019   CL 102 06/28/2019   CO2 26 06/28/2019    Anesthesia Physical  Anesthesia Plan  ASA: I  Anesthesia Plan: General   Post-op Pain Management:    Induction: Intravenous  PONV Risk Score and Plan: 1 and Dexamethasone, Ondansetron and Treatment may vary due to age or medical condition  Airway Management Planned: Oral ETT  Additional Equipment:   Intra-op Plan:   Post-operative Plan: Extubation in OR  Informed Consent: I have reviewed the patients History and Physical, chart, labs and discussed the procedure including the risks, benefits and alternatives for the proposed anesthesia with the patient or authorized representative who has indicated his/her understanding and acceptance.      Dental advisory given  Plan Discussed with: Anesthesiologist, CRNA and Surgeon  Anesthesia Plan Comments: ( )       Anesthesia Quick Evaluation

## 2019-07-01 ENCOUNTER — Other Ambulatory Visit: Payer: Self-pay

## 2019-07-01 ENCOUNTER — Ambulatory Visit (HOSPITAL_COMMUNITY): Payer: BC Managed Care – PPO | Admitting: Vascular Surgery

## 2019-07-01 ENCOUNTER — Encounter (HOSPITAL_COMMUNITY)
Admission: RE | Disposition: A | Discharge status: Home / Self Care | Payer: Self-pay | Source: Home / Self Care | Attending: Thoracic Surgery (Cardiothoracic Vascular Surgery)

## 2019-07-01 ENCOUNTER — Encounter (HOSPITAL_COMMUNITY): Payer: Self-pay | Admitting: Thoracic Surgery (Cardiothoracic Vascular Surgery)

## 2019-07-01 ENCOUNTER — Ambulatory Visit (HOSPITAL_COMMUNITY)
Admission: RE | Admit: 2019-07-01 | Discharge: 2019-07-01 | Disposition: A | Discharge status: Home / Self Care | Payer: BC Managed Care – PPO | Attending: Thoracic Surgery (Cardiothoracic Vascular Surgery) | Admitting: Thoracic Surgery (Cardiothoracic Vascular Surgery)

## 2019-07-01 ENCOUNTER — Ambulatory Visit (HOSPITAL_COMMUNITY): Payer: BC Managed Care – PPO | Admitting: Certified Registered Nurse Anesthetist

## 2019-07-01 DIAGNOSIS — G709 Myoneural disorder, unspecified: Secondary | ICD-10-CM | POA: Insufficient documentation

## 2019-07-01 DIAGNOSIS — X58XXXA Exposure to other specified factors, initial encounter: Secondary | ICD-10-CM | POA: Insufficient documentation

## 2019-07-01 DIAGNOSIS — Z801 Family history of malignant neoplasm of trachea, bronchus and lung: Secondary | ICD-10-CM | POA: Insufficient documentation

## 2019-07-01 DIAGNOSIS — T85848A Pain due to other internal prosthetic devices, implants and grafts, initial encounter: Secondary | ICD-10-CM | POA: Insufficient documentation

## 2019-07-01 DIAGNOSIS — Z87891 Personal history of nicotine dependence: Secondary | ICD-10-CM | POA: Insufficient documentation

## 2019-07-01 DIAGNOSIS — R0789 Other chest pain: Secondary | ICD-10-CM

## 2019-07-01 DIAGNOSIS — Z803 Family history of malignant neoplasm of breast: Secondary | ICD-10-CM | POA: Insufficient documentation

## 2019-07-01 HISTORY — PX: STERNAL WIRES REMOVAL: SHX2441

## 2019-07-01 SURGERY — REMOVAL, STERNAL WIRE
Anesthesia: General | Site: Chest

## 2019-07-01 MED ORDER — ONDANSETRON HCL 4 MG/2ML IJ SOLN
INTRAMUSCULAR | Status: AC
  Filled 2019-07-01: qty 2

## 2019-07-01 MED ORDER — OXYCODONE HCL 5 MG PO TABS: 5.0000 mg | ORAL_TABLET | Freq: Four times a day (QID) | ORAL | Status: DC | PRN

## 2019-07-01 MED ORDER — SODIUM CHLORIDE 0.9% FLUSH: 3.0000 mL | Freq: Two times a day (BID) | INTRAVENOUS | Status: DC

## 2019-07-01 MED ORDER — LIDOCAINE 2% (20 MG/ML) 5 ML SYRINGE
INTRAMUSCULAR | Status: AC
  Filled 2019-07-01: qty 5

## 2019-07-01 MED ORDER — CEFAZOLIN SODIUM-DEXTROSE 2-4 GM/100ML-% IV SOLN
INTRAVENOUS | Status: AC
  Filled 2019-07-01: qty 100

## 2019-07-01 MED ORDER — FENTANYL CITRATE (PF) 100 MCG/2ML IJ SOLN: 25.0000 ug | INTRAMUSCULAR | Status: DC | PRN

## 2019-07-01 MED ORDER — BUPIVACAINE LIPOSOME 1.3 % IJ SUSP
10.0000 mL | Freq: Once | INTRAMUSCULAR | Status: DC
  Filled 2019-07-01: qty 10

## 2019-07-01 MED ORDER — ONDANSETRON HCL 4 MG/2ML IJ SOLN
INTRAMUSCULAR | Status: DC | PRN
  Administered 2019-07-01: 4 mg via INTRAVENOUS

## 2019-07-01 MED ORDER — ACETAMINOPHEN 500 MG PO TABS: 1000.0000 mg | ORAL_TABLET | Freq: Once | ORAL | Status: AC

## 2019-07-01 MED ORDER — SUCCINYLCHOLINE CHLORIDE 200 MG/10ML IV SOSY
PREFILLED_SYRINGE | INTRAVENOUS | Status: AC
  Filled 2019-07-01: qty 10

## 2019-07-01 MED ORDER — ACETAMINOPHEN 500 MG PO TABS
ORAL_TABLET | ORAL | Status: AC
  Administered 2019-07-01: 1000 mg via ORAL
  Filled 2019-07-01: qty 2

## 2019-07-01 MED ORDER — LIDOCAINE HCL (PF) 1 % IJ SOLN
INTRAMUSCULAR | Status: AC
  Filled 2019-07-01: qty 30

## 2019-07-01 MED ORDER — PROPOFOL 10 MG/ML IV BOLUS
INTRAVENOUS | Status: DC | PRN
  Administered 2019-07-01: 50 mg via INTRAVENOUS
  Administered 2019-07-01: 200 mg via INTRAVENOUS

## 2019-07-01 MED ORDER — LACTATED RINGERS IV SOLN: INTRAVENOUS | Status: DC | PRN

## 2019-07-01 MED ORDER — ROCURONIUM BROMIDE 10 MG/ML (PF) SYRINGE
PREFILLED_SYRINGE | INTRAVENOUS | Status: DC | PRN
  Administered 2019-07-01: 50 mg via INTRAVENOUS

## 2019-07-01 MED ORDER — 0.9 % SODIUM CHLORIDE (POUR BTL) OPTIME
TOPICAL | Status: DC | PRN
  Administered 2019-07-01: 1000 mL

## 2019-07-01 MED ORDER — CEFAZOLIN SODIUM-DEXTROSE 2-4 GM/100ML-% IV SOLN
2.0000 g | INTRAVENOUS | Status: AC
  Administered 2019-07-01: 08:00:00 2 g via INTRAVENOUS

## 2019-07-01 MED ORDER — CELECOXIB 200 MG PO CAPS
ORAL_CAPSULE | ORAL | Status: AC
  Administered 2019-07-01: 200 mg via ORAL
  Filled 2019-07-01: qty 1

## 2019-07-01 MED ORDER — BUPIVACAINE LIPOSOME 1.3 % IJ SUSP
INTRAMUSCULAR | Status: DC | PRN
  Administered 2019-07-01: 10 mL

## 2019-07-01 MED ORDER — CELECOXIB 200 MG PO CAPS: 200.0000 mg | ORAL_CAPSULE | Freq: Once | ORAL | Status: AC

## 2019-07-01 MED ORDER — MIDAZOLAM HCL 5 MG/5ML IJ SOLN
INTRAMUSCULAR | Status: DC | PRN
  Administered 2019-07-01: 2 mg via INTRAVENOUS

## 2019-07-01 MED ORDER — DEXAMETHASONE SODIUM PHOSPHATE 10 MG/ML IJ SOLN
INTRAMUSCULAR | Status: DC | PRN
  Administered 2019-07-01: 4 mg via INTRAVENOUS

## 2019-07-01 MED ORDER — ONDANSETRON HCL 4 MG/2ML IJ SOLN: 4.0000 mg | Freq: Once | INTRAMUSCULAR | Status: DC | PRN

## 2019-07-01 MED ORDER — SUGAMMADEX SODIUM 200 MG/2ML IV SOLN
INTRAVENOUS | Status: DC | PRN
  Administered 2019-07-01: 250 mg via INTRAVENOUS

## 2019-07-01 MED ORDER — SODIUM CHLORIDE 0.9 % IV SOLN: 250.0000 mL | INTRAVENOUS | Status: DC | PRN

## 2019-07-01 MED ORDER — DEXAMETHASONE SODIUM PHOSPHATE 10 MG/ML IJ SOLN
INTRAMUSCULAR | Status: AC
  Filled 2019-07-01: qty 1

## 2019-07-01 MED ORDER — FENTANYL CITRATE (PF) 250 MCG/5ML IJ SOLN
INTRAMUSCULAR | Status: AC
  Filled 2019-07-01: qty 5

## 2019-07-01 MED ORDER — MIDAZOLAM HCL 2 MG/2ML IJ SOLN
INTRAMUSCULAR | Status: AC
  Filled 2019-07-01: qty 2

## 2019-07-01 MED ORDER — SODIUM CHLORIDE 0.9% FLUSH: 3.0000 mL | INTRAVENOUS | Status: DC | PRN

## 2019-07-01 MED ORDER — ACETAMINOPHEN 325 MG PO TABS: 650.0000 mg | ORAL_TABLET | ORAL | Status: DC | PRN

## 2019-07-01 MED ORDER — LIDOCAINE 2% (20 MG/ML) 5 ML SYRINGE
INTRAMUSCULAR | Status: DC | PRN
  Administered 2019-07-01: 80 mg via INTRAVENOUS

## 2019-07-01 MED ORDER — FENTANYL CITRATE (PF) 250 MCG/5ML IJ SOLN
INTRAMUSCULAR | Status: DC | PRN
  Administered 2019-07-01: 50 ug via INTRAVENOUS
  Administered 2019-07-01: 100 ug via INTRAVENOUS
  Administered 2019-07-01: 50 ug via INTRAVENOUS

## 2019-07-01 MED ORDER — PROPOFOL 10 MG/ML IV BOLUS
INTRAVENOUS | Status: AC
  Filled 2019-07-01: qty 40

## 2019-07-01 MED ORDER — OXYCODONE HCL 5 MG PO TABS: 5.0000 mg | ORAL_TABLET | Freq: Four times a day (QID) | ORAL | 0 refills | Status: AC | PRN

## 2019-07-01 MED ORDER — ACETAMINOPHEN 650 MG RE SUPP: 650.0000 mg | RECTAL | Status: DC | PRN

## 2019-07-01 SURGICAL SUPPLY — 47 items
BLADE CLIPPER SURG (BLADE) ×3 IMPLANT
BLADE SURG 10 STRL SS (BLADE) IMPLANT
CANISTER SUCT 3000ML PPV (MISCELLANEOUS) ×3 IMPLANT
CLIP VESOCCLUDE MED 6/CT (CLIP) ×6 IMPLANT
CNTNR URN SCR LID CUP LEK RST (MISCELLANEOUS) IMPLANT
CONT SPEC 4OZ STRL OR WHT (MISCELLANEOUS)
DERMABOND ADVANCED (GAUZE/BANDAGES/DRESSINGS) ×2
DERMABOND ADVANCED .7 DNX12 (GAUZE/BANDAGES/DRESSINGS) ×1 IMPLANT
DRAPE INCISE IOBAN 66X45 STRL (DRAPES) IMPLANT
DRAPE LAPAROSCOPIC ABDOMINAL (DRAPES) ×3 IMPLANT
DRAPE SLUSH/WARMER DISC (DRAPES) IMPLANT
ELECT REM PT RETURN 9FT ADLT (ELECTROSURGICAL) ×3
ELECTRODE REM PT RTRN 9FT ADLT (ELECTROSURGICAL) ×1 IMPLANT
GAUZE SPONGE 4X4 12PLY STRL (GAUZE/BANDAGES/DRESSINGS) ×3 IMPLANT
GLOVE BIOGEL PI IND STRL 6.5 (GLOVE) ×1 IMPLANT
GLOVE BIOGEL PI INDICATOR 6.5 (GLOVE) ×2
GLOVE SURG SIGNA 7.5 PF LTX (GLOVE) ×6 IMPLANT
GLOVE TRIUMPH SURG SIZE 7.5 (KITS) ×3 IMPLANT
GOWN STRL REUS W/ TWL LRG LVL3 (GOWN DISPOSABLE) ×1 IMPLANT
GOWN STRL REUS W/ TWL XL LVL3 (GOWN DISPOSABLE) ×1 IMPLANT
GOWN STRL REUS W/TWL LRG LVL3 (GOWN DISPOSABLE) ×2
GOWN STRL REUS W/TWL XL LVL3 (GOWN DISPOSABLE) ×2
HEMOSTAT POWDER SURGIFOAM 1G (HEMOSTASIS) IMPLANT
KIT BASIN OR (CUSTOM PROCEDURE TRAY) ×3 IMPLANT
KIT TURNOVER KIT B (KITS) ×3 IMPLANT
NEEDLE 18GX1X1/2 (RX/OR ONLY) (NEEDLE) ×3 IMPLANT
NEEDLE 25GX 5/8IN NON SAFETY (NEEDLE) IMPLANT
NS IRRIG 1000ML POUR BTL (IV SOLUTION) ×3 IMPLANT
PACK CHEST (CUSTOM PROCEDURE TRAY) ×3 IMPLANT
PAD ARMBOARD 7.5X6 YLW CONV (MISCELLANEOUS) ×6 IMPLANT
SPONGE LAP 18X18 RF (DISPOSABLE) IMPLANT
SUT STEEL 6MS V (SUTURE) IMPLANT
SUT STEEL STERNAL CCS#1 18IN (SUTURE) IMPLANT
SUT STEEL SZ 6 DBL 3X14 BALL (SUTURE) IMPLANT
SUT VIC AB 1 CTX 36 (SUTURE) ×4
SUT VIC AB 1 CTX36XBRD ANBCTR (SUTURE) ×2 IMPLANT
SUT VIC AB 2-0 CTX 27 (SUTURE) ×6 IMPLANT
SUT VIC AB 2-0 SH 27 (SUTURE) ×2
SUT VIC AB 2-0 SH 27XBRD (SUTURE) ×1 IMPLANT
SUT VIC AB 3-0 X1 27 (SUTURE) ×6 IMPLANT
SWAB COLLECTION DEVICE MRSA (MISCELLANEOUS) IMPLANT
SWAB CULTURE ESWAB REG 1ML (MISCELLANEOUS) IMPLANT
SYR CONTROL 10ML LL (SYRINGE) ×3 IMPLANT
TOWEL GREEN STERILE (TOWEL DISPOSABLE) ×3 IMPLANT
TOWEL GREEN STERILE FF (TOWEL DISPOSABLE) ×3 IMPLANT
TRAY FOLEY MTR SLVR 14FR STAT (SET/KITS/TRAYS/PACK) IMPLANT
WATER STERILE IRR 1000ML POUR (IV SOLUTION) ×3 IMPLANT

## 2019-07-01 NOTE — Interval H&P Note (Signed)
History and Physical Interval Note:  07/01/2019 7:16 AM  Christian Ho  has presented today for surgery, with the diagnosis of sternal pain.  The various methods of treatment have been discussed with the patient and family. After consideration of risks, benefits and other options for treatment, the patient has consented to  Procedure(s): STERNAL WIRES REMOVAL (N/A) as a surgical intervention.  The patient's history has been reviewed, patient examined, no change in status, stable for surgery.  I have reviewed the patient's chart and labs.  Questions were answered to the patient's satisfaction.     Melrose Nakayama

## 2019-07-01 NOTE — Transfer of Care (Signed)
Immediate Anesthesia Transfer of Care Note  Patient: Christian Ho  Procedure(s) Performed: STERNAL WIRES REMOVAL (N/A Chest)  Patient Location: PACU  Anesthesia Type:General  Level of Consciousness: awake, alert , oriented and patient cooperative  Airway & Oxygen Therapy: Patient Spontanous Breathing and Patient connected to face mask oxygen  Post-op Assessment: Report given to RN, Post -op Vital signs reviewed and stable and Patient moving all extremities X 4  Post vital signs: Reviewed and stable  Last Vitals:  Vitals Value Taken Time  BP 178/136 07/01/19 0835  Temp    Pulse 100 07/01/19 0836  Resp 17 07/01/19 0836  SpO2 95 % 07/01/19 0836  Vitals shown include unvalidated device data.  Last Pain:  Vitals:   07/01/19 0556  TempSrc:   PainSc: 4       Patients Stated Pain Goal: 3 ([id number])  Complications: No apparent anesthesia complications

## 2019-07-01 NOTE — Anesthesia Postprocedure Evaluation (Signed)
Anesthesia Post Note  Patient: Christian Ho  Procedure(s) Performed: STERNAL WIRES REMOVAL (N/A Chest)     Patient location during evaluation: PACU Anesthesia Type: General Level of consciousness: awake and alert Pain management: pain level controlled Vital Signs Assessment: post-procedure vital signs reviewed and stable Respiratory status: spontaneous breathing, nonlabored ventilation, respiratory function stable and patient connected to nasal cannula oxygen Cardiovascular status: blood pressure returned to baseline and stable Postop Assessment: no apparent nausea or vomiting Anesthetic complications: no    Last Vitals:  Vitals:   07/01/19 0900 07/01/19 0903  BP:  128/71  Pulse: 82 76  Resp: 14 10  Temp:  36.7 C  SpO2: 97% 97%    Last Pain:  Vitals:   07/01/19 0903  TempSrc:   PainSc: 0-No pain                 Tiajuana Amass

## 2019-07-01 NOTE — Anesthesia Procedure Notes (Signed)
Procedure Name: Intubation Date/Time: 07/01/2019 7:39 AM Performed by: Colin Benton, CRNA Pre-anesthesia Checklist: Patient identified, Emergency Drugs available, Suction available and Patient being monitored Patient Re-evaluated:Patient Re-evaluated prior to induction Oxygen Delivery Method: Circle system utilized Preoxygenation: Pre-oxygenation with 100% oxygen Induction Type: IV induction Ventilation: Mask ventilation without difficulty and Oral airway inserted - appropriate to patient size Laryngoscope Size: Mac and 3 Grade View: Grade I Tube type: Oral Tube size: 7.5 mm Number of attempts: 1 Airway Equipment and Method: Stylet Placement Confirmation: ETT inserted through vocal cords under direct vision,  positive ETCO2 and breath sounds checked- equal and bilateral Secured at: 23 cm Tube secured with: Tape Dental Injury: Teeth and Oropharynx as per pre-operative assessment

## 2019-07-01 NOTE — Discharge Instructions (Signed)
Do not drive or engage in heavy physical activity for 24 hours  You may resume normal activities tomorrow. You may shower tomorrow.  Do not submerge incision under water.  You have a prescription for oxycodone, a narcotic, for pain, You may use as directed. You may use acetaminophen (Tylenol) or ibuprofen (Advil, Motrin) in addition to, or instead of, the oxycodone.  My office will contact you with a follow up appointment.  Call [phone] if you develop excessive pain, swelling, redness or drainage from the incision.

## 2019-07-01 NOTE — Brief Op Note (Signed)
07/01/2019  8:25 AM  PATIENT:  Janace Litten  29 y.o. male  PRE-OPERATIVE DIAGNOSIS:  Sternal pain  POST-OPERATIVE DIAGNOSIS:  Sternal pain.  PROCEDURE:  Procedure(s): STERNAL WIRES REMOVAL (N/A)  SURGEON:  Surgeon(s) and Role:    * Melrose Nakayama, MD - Primary  PHYSICIAN ASSISTANT:   ASSISTANTS: none   ANESTHESIA:   local and general  EBL:  5 mL   BLOOD ADMINISTERED:none  DRAINS: none   LOCAL MEDICATIONS USED:  OTHER Exparel 10 ml  SPECIMEN:  Source of Specimen:  wires  DISPOSITION OF SPECIMEN:  PATHOLOGY  COUNTS:  YES  TOURNIQUET:  * No tourniquets in log *  DICTATION: .Other Dictation: Dictation Number -  PLAN OF CARE: Discharge to home after PACU  PATIENT DISPOSITION:  PACU - hemodynamically stable.   Delay start of Pharmacological VTE agent (>24hrs) due to surgical blood loss or risk of bleeding: not applicable

## 2019-07-02 NOTE — Op Note (Signed)
NAME: Christian Ho, Christian Ho MEDICAL RECORD [id number] ACCOUNT [id number] DATE OF BIRTH:03-12-1991 FACILITY: [facility] LOCATION: [facility] PHYSICIAN:Shylo Zamor Chaya Jan, MD  OPERATIVE REPORT  DATE OF PROCEDURE:  07/01/2019  PREOPERATIVE DIAGNOSIS:  Pain after previous partial sternotomy.  POSTOPERATIVE DIAGNOSIS:  Pain after previous partial sternotomy.  PROCEDURE:  Sternal wire removal.  SURGEON:  Modesto Charon, MD  ASSISTANT:  None.  ANESTHESIA:  General.  FINDINGS:  Transverse wires intact. right lateral wire fractured.  CLINICAL NOTE:  The patient is a 29 year old man who had a partial sternotomy for thymectomy about 7 years ago.  Recently, he noted a sensation of something poking him in the chest in the area of the incision.  On exam, his sternum appeared well healed.   I recommended sternal wire removal to see if that would resolve his symptoms.  The indications, risks, benefits, and alternatives were discussed in detail with the patient.  He understood and accepted the risks and agreed to proceed.  DESCRIPTION OF PROCEDURE:  Mr. Mcrorie was brought to the operating room on 07/01/2019.  He had induction of general anesthesia and was intubated.  Sequential compression devices were placed on the calves for DVT prophylaxis.  A Bair warmer was placed to  maintain normothermia.  Intravenous antibiotics were administered.  The chest and neck were prepped and draped in the usual sterile fashion.  A timeout was performed.  An incision was made through the previous scar and was carried through the skin and subcutaneous tissue.  The transverse wires were identified, dissected out, cut and removed, and then the vertical wire on the left side was dissected out and removed.  The wire on the right side was fractured and was removed without having to cut the wire.  The wound was copiously irrigated with warm saline.  The operative site was infiltrated with  10 mL of Exparel.  Hemostasis was  achieved.  The wound was closed in 2 layers.  Dermabond was applied to the skin.  The patient then was taken from the operating room to the Rew Unit in good condition.  LN/NUANCE  D:07/01/2019 T:07/02/2019 JOB:[id number]

## 2019-07-15 ENCOUNTER — Other Ambulatory Visit: Payer: Self-pay | Admitting: Thoracic Surgery (Cardiothoracic Vascular Surgery)

## 2019-07-15 DIAGNOSIS — J9859 Other diseases of mediastinum, not elsewhere classified: Secondary | ICD-10-CM

## 2019-07-19 ENCOUNTER — Ambulatory Visit
Admission: RE | Admit: 2019-07-19 | Discharge: 2019-07-19 | Disposition: A | Discharge status: Home / Self Care | Payer: BC Managed Care – PPO | Source: Ambulatory Visit | Attending: Thoracic Surgery (Cardiothoracic Vascular Surgery) | Admitting: Thoracic Surgery (Cardiothoracic Vascular Surgery)

## 2019-07-19 ENCOUNTER — Encounter: Payer: Self-pay | Admitting: Thoracic Surgery (Cardiothoracic Vascular Surgery)

## 2019-07-19 ENCOUNTER — Ambulatory Visit (INDEPENDENT_AMBULATORY_CARE_PROVIDER_SITE_OTHER): Payer: Self-pay | Admitting: Thoracic Surgery (Cardiothoracic Vascular Surgery)

## 2019-07-19 ENCOUNTER — Other Ambulatory Visit: Payer: Self-pay

## 2019-07-19 VITALS — BP 140/88 | HR 88 | Resp 20 | Ht 70.5 in | Wt 255.0 lb

## 2019-07-19 DIAGNOSIS — Z09 Encounter for follow-up examination after completed treatment for conditions other than malignant neoplasm: Secondary | ICD-10-CM

## 2019-07-19 DIAGNOSIS — R918 Other nonspecific abnormal finding of lung field: Secondary | ICD-10-CM | POA: Diagnosis not present

## 2019-07-19 DIAGNOSIS — R0789 Other chest pain: Secondary | ICD-10-CM

## 2019-07-19 DIAGNOSIS — J9859 Other diseases of mediastinum, not elsewhere classified: Secondary | ICD-10-CM

## 2019-07-19 NOTE — Progress Notes (Signed)
      Christian Ho       [place] [address]             [phone]      HPI: Christian Ho returns for a scheduled postoperative follow-up visit   Christian Ho is a 29 year old man who had a partial sternotomy for thymic resection in 2013.  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  He did well postoperatively.  In January he started to notice a sensation of something poking him in his chest this was just to the right of the sternum and there was a palpable wire in that area.  I did a sternal wire removal on 07/01/2019.  He was sore for a couple of days afterwards but once the incisional pain resolved, his other pain completely resolved as well.  Past Medical History:  Diagnosis Date  . Neuromuscular disorder ([place])    nerve pain- L shoulder- ? related mass in chest    . Shortness of breath    sob, early March, with shoulder pain      Current Outpatient Medications  Medication Sig Dispense Refill  . Ascorbic Acid (VITAMIN C) 1000 MG tablet Take 1,000 mg by mouth daily.    . Cholecalciferol (VITAMIN D-3) 125 MCG (5000 UT) TABS Take 5,000 Units by mouth daily.    . Ferrous Sulfate (IRON PO) Take 250 mg by mouth daily.    Marland Kitchen oxyCODONE (OXY IR/ROXICODONE) 5 MG immediate release tablet Take 1-2 tablets (5-10 mg total) by mouth every 6 (six) hours as needed for moderate pain or severe pain. 24 tablet 0  . zinc gluconate 50 MG tablet Take 50 mg by mouth daily.     No current facility-administered medications for this visit.    Physical Exam BP 140/88   Pulse 88   Resp 20   Ht 5' 10.5" (1.791 m)   Wt 255 lb (115.7 kg)   SpO2 96% Comment: RA  BMI 36.30 kg/m  28 year old man in no acute distress Alert and oriented x3 with no focal deficits Incision healing well  Diagnostic Tests: Chest x-ray unremarkable  Impression: Christian Ho is a 29 year old man who had a partial sternotomy for thymic resection in 2013.  Earlier this year he developed a  sharp stabbing type of pain just to the right of the sternum in the area of one of his wires.  I did a sternal wire removal on 07/01/2019.  That pain resolved.  Of note the right wire was fractured posteriorly.  He is recovering well.  His wound is healing nicely.  There are no restrictions on his activities.  Plan: I will be happy to see Christian Ho back at anytime in the future if I can be of any further assistance with his care.  Melrose Nakayama, MD [facility] [phone]

## 2019-08-17 DIAGNOSIS — D485 Neoplasm of uncertain behavior of skin: Secondary | ICD-10-CM | POA: Diagnosis not present

## 2019-08-17 DIAGNOSIS — D225 Melanocytic nevi of trunk: Secondary | ICD-10-CM | POA: Diagnosis not present

## 2019-08-23 DIAGNOSIS — Z20828 Contact with and (suspected) exposure to other viral communicable diseases: Secondary | ICD-10-CM | POA: Diagnosis not present

## 2019-08-23 DIAGNOSIS — J029 Acute pharyngitis, unspecified: Secondary | ICD-10-CM | POA: Diagnosis not present

## 2020-05-31 DIAGNOSIS — Z0001 Encounter for general adult medical examination with abnormal findings: Secondary | ICD-10-CM | POA: Diagnosis not present

## 2020-05-31 DIAGNOSIS — Z1331 Encounter for screening for depression: Secondary | ICD-10-CM | POA: Diagnosis not present

## 2020-05-31 DIAGNOSIS — E7800 Pure hypercholesterolemia, unspecified: Secondary | ICD-10-CM | POA: Diagnosis not present

## 2020-05-31 DIAGNOSIS — R001 Bradycardia, unspecified: Secondary | ICD-10-CM | POA: Diagnosis not present

## 2020-05-31 DIAGNOSIS — Z131 Encounter for screening for diabetes mellitus: Secondary | ICD-10-CM | POA: Diagnosis not present

## 2020-06-21 DIAGNOSIS — R7401 Elevation of levels of liver transaminase levels: Secondary | ICD-10-CM | POA: Diagnosis not present

## 2021-09-09 IMAGING — CR DG CHEST 2V
2 series · 2 of 2 positions shown · non-contrast
Comparison: Chest radiograph [DATE] 07/02/2019; CT chest November 24, 2018

CLINICAL DATA: Previous thymic lesion with recent removal of
sternal wires

EXAM:
CHEST - 2 VIEW

[w chest pa]
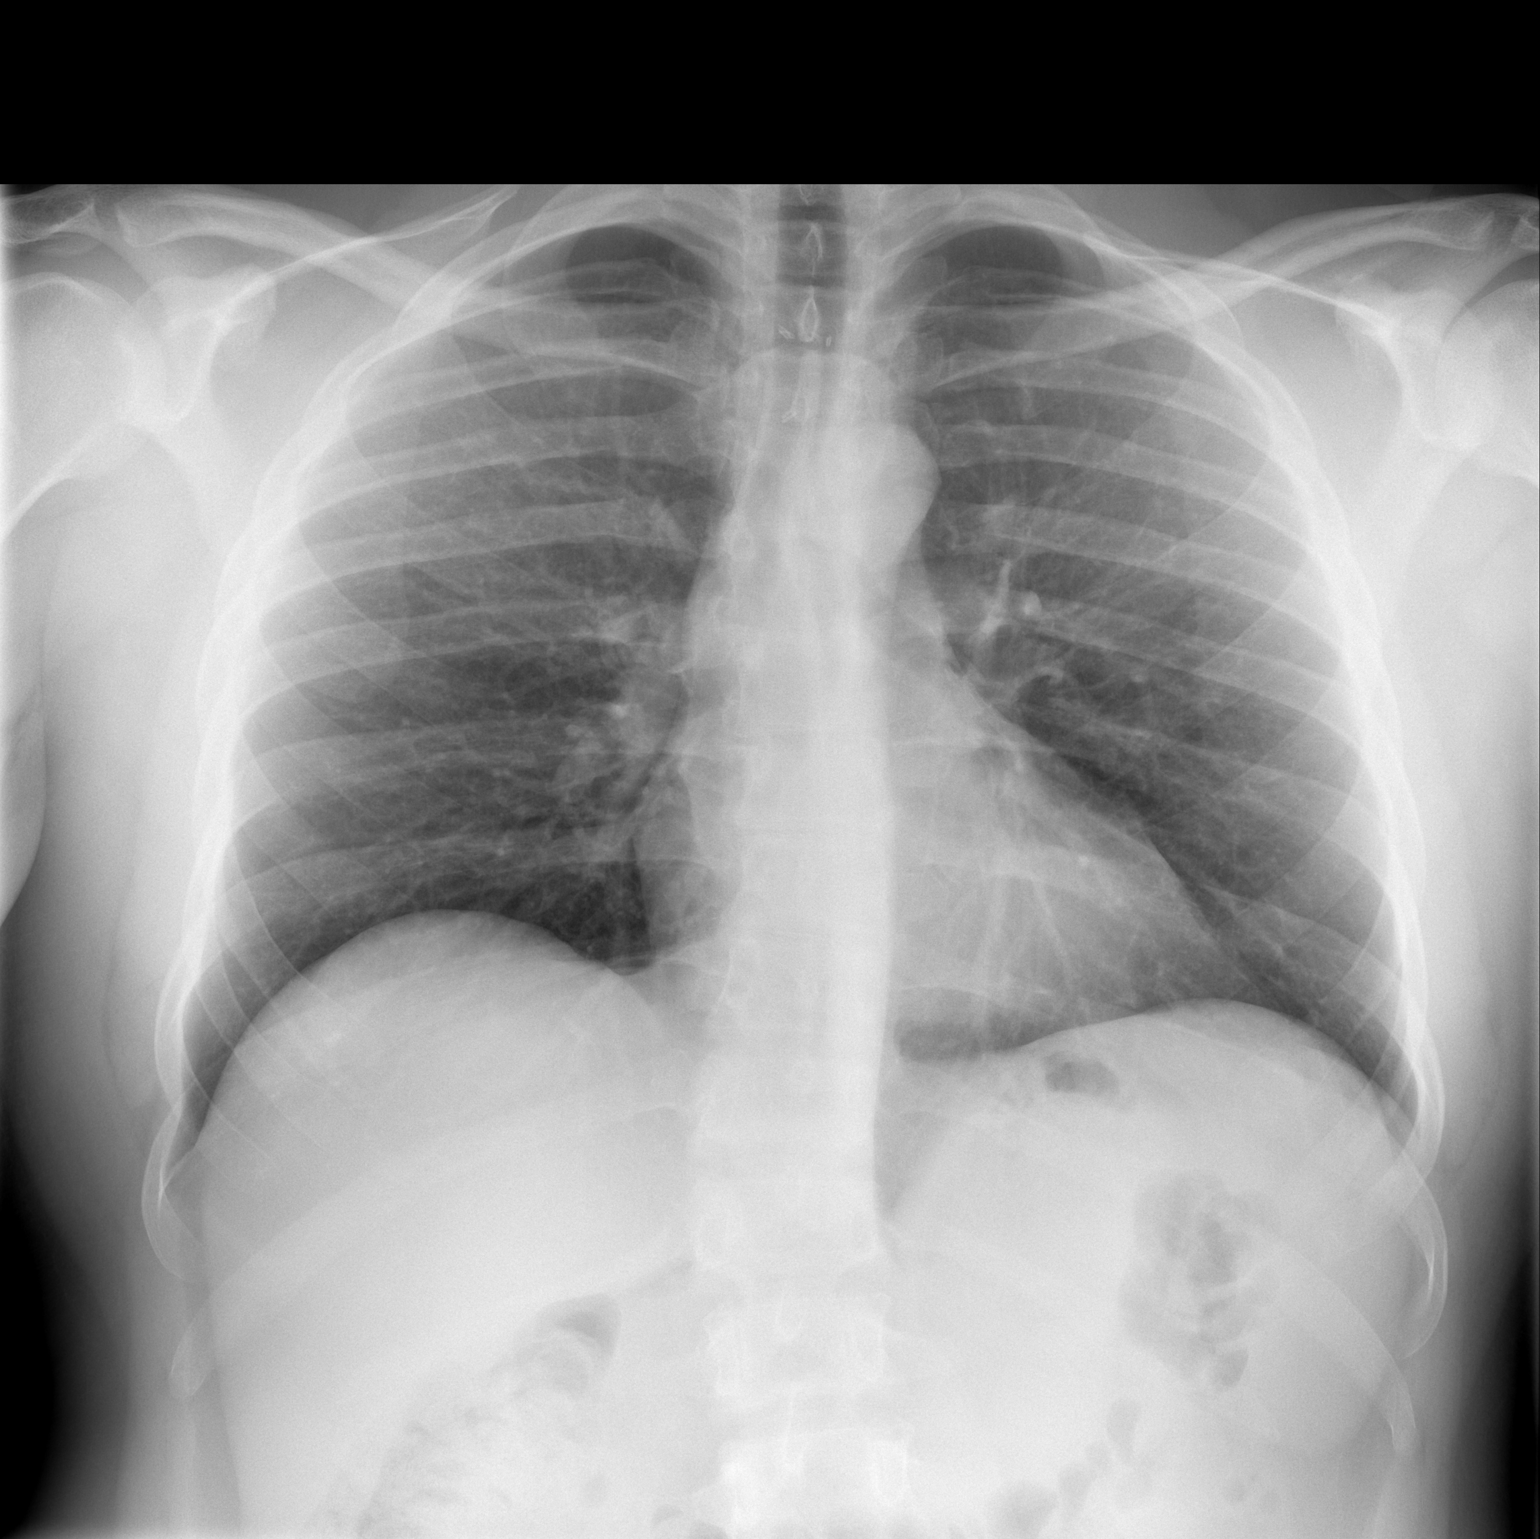

[w chest lat]
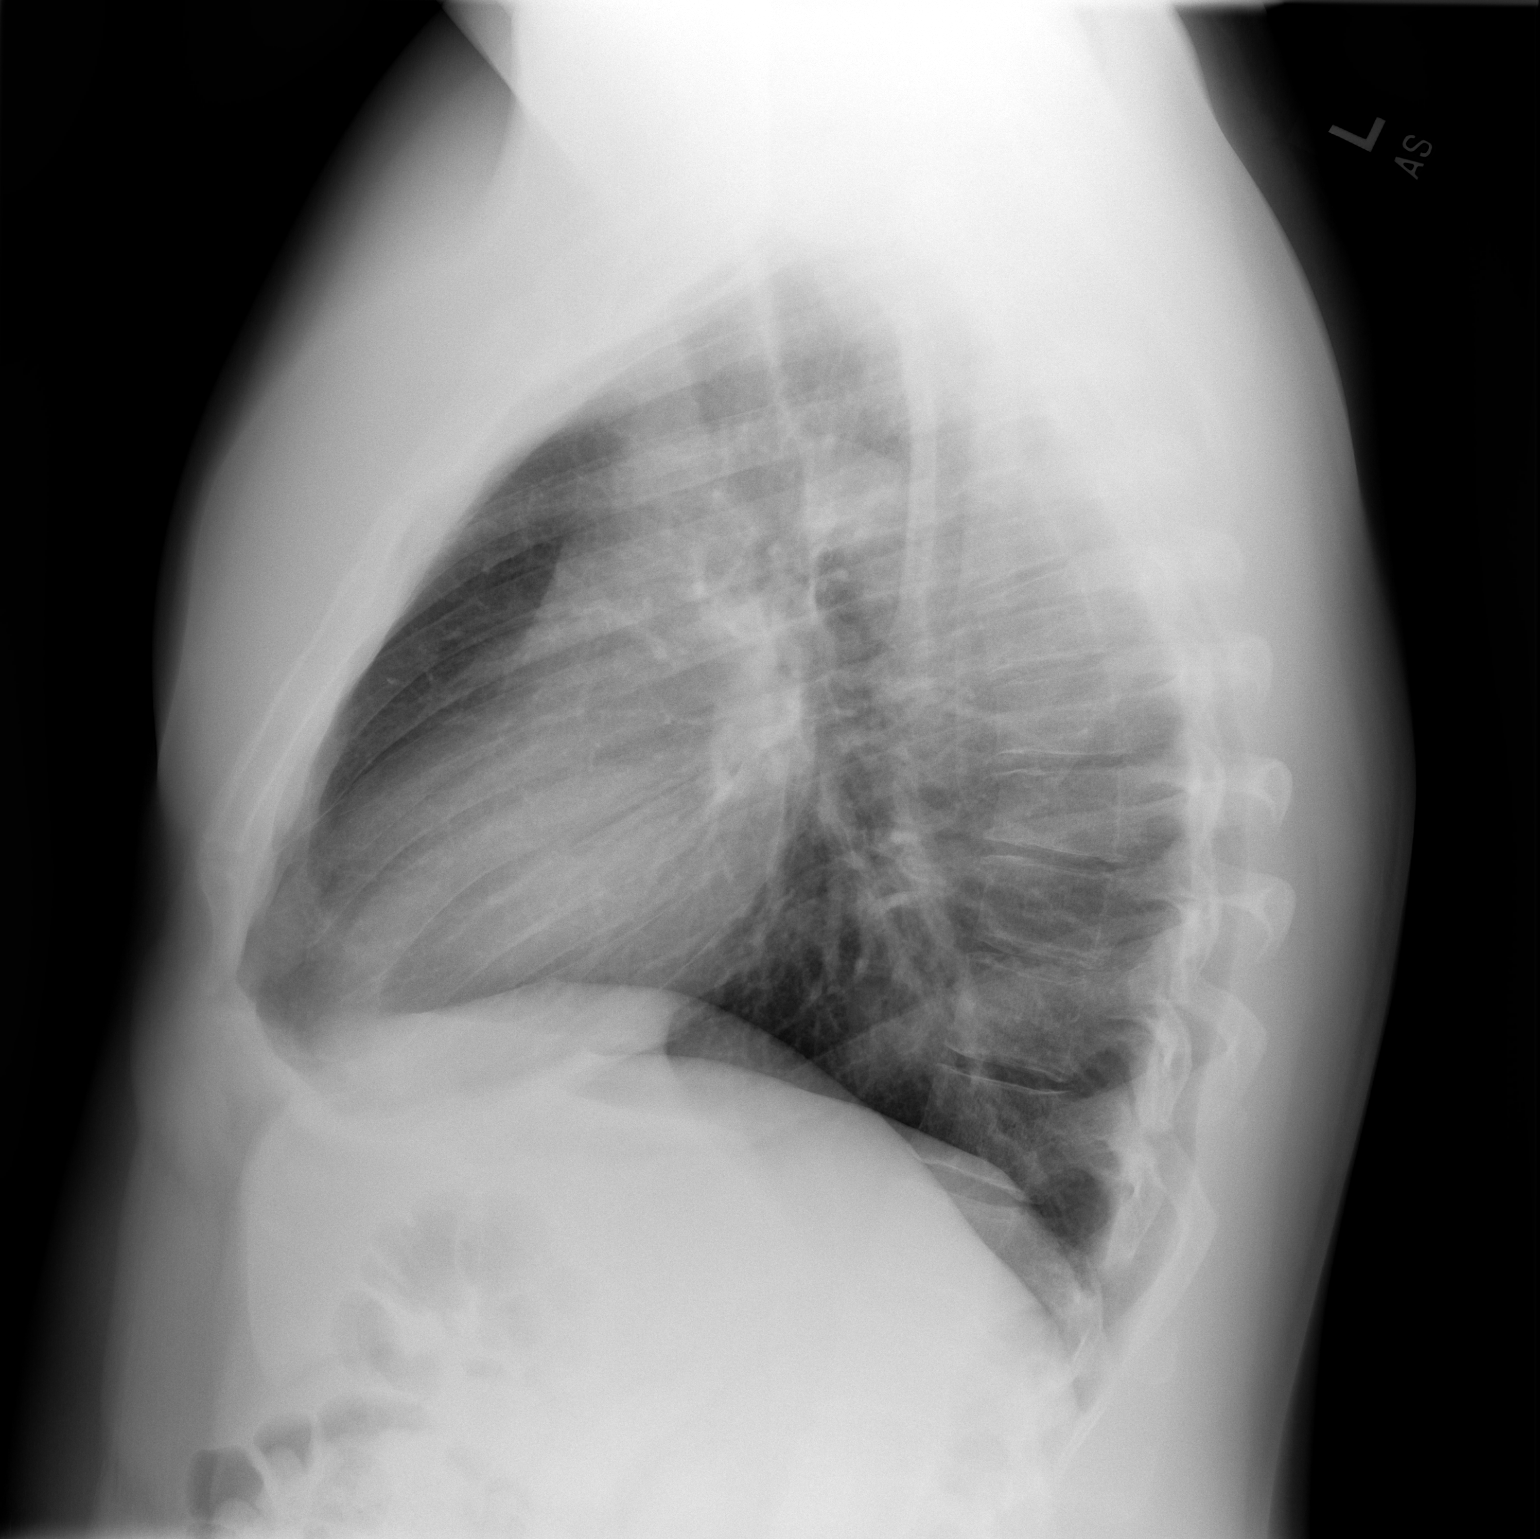

[2 of 2 positions shown; findings below may reference images not displayed]

FINDINGS: Postoperative changes noted in the anterior mediastinal region.
Lungs are clear. Heart size and pulmonary vascular normal. No hilar
or mediastinal adenopathy. No pneumothorax. No mediastinal
prominence.

There appears to be soft tissue prominence in the supraclavicular
regions as well as in the anterior chest wall on lateral view. No
bony lesion.
IMPRESSION: Soft tissue prominence noted in the supraclavicular regions by
radiography as well as in the anterior chest wall. Etiology for this
prominence uncertain. It may be prudent to correlate with contrast
enhanced chest CT given this appearance.

Mediastinum does not appear widened. Cardiac silhouette normal. No
hilar or mediastinal adenopathy appreciable by radiography. Lungs
clear.

These results will be called to the ordering clinician or
representative by the Radiologist Assistant, and communication
documented in the PACS or zVision Dashboard.
